# Patient Record
Sex: Female | Born: 1955 | Race: White | Hispanic: No | Marital: Married | Smoking: Never smoker
Health system: Southern US, Community
[De-identification: ages and names within clinical notes are randomized; demographics above are authoritative.]

## PROBLEM LIST (undated history)

## (undated) DIAGNOSIS — C50919 Malignant neoplasm of unspecified site of unspecified female breast: Secondary | ICD-10-CM

## (undated) DIAGNOSIS — Z8041 Family history of malignant neoplasm of ovary: Secondary | ICD-10-CM

## (undated) DIAGNOSIS — E039 Hypothyroidism, unspecified: Secondary | ICD-10-CM

## (undated) DIAGNOSIS — K219 Gastro-esophageal reflux disease without esophagitis: Secondary | ICD-10-CM

## (undated) DIAGNOSIS — Z8052 Family history of malignant neoplasm of bladder: Secondary | ICD-10-CM

## (undated) DIAGNOSIS — Z800 Family history of malignant neoplasm of digestive organs: Secondary | ICD-10-CM

## (undated) HISTORY — DX: Family history of malignant neoplasm of digestive organs: Z80.0

## (undated) HISTORY — PX: BILATERAL TOTAL MASTECTOMY WITH AXILLARY LYMPH NODE DISSECTION: SHX6364

## (undated) HISTORY — DX: Family history of malignant neoplasm of bladder: Z80.52

## (undated) HISTORY — DX: Family history of malignant neoplasm of ovary: Z80.41

## (undated) HISTORY — DX: Malignant neoplasm of unspecified site of unspecified female breast: C50.919

## (undated) HISTORY — PX: BREAST LUMPECTOMY: SHX2

---

## 1985-09-06 HISTORY — PX: DIAGNOSTIC LAPAROSCOPY: SUR761

## 2001-02-22 ENCOUNTER — Ambulatory Visit: Admission: RE | Admit: 2001-02-22 | Discharge: 2001-04-05 | Payer: Self-pay | Admitting: Radiation Oncology

## 2001-04-06 ENCOUNTER — Ambulatory Visit: Admission: RE | Admit: 2001-04-06 | Discharge: 2001-07-05 | Payer: Self-pay | Admitting: Radiation Oncology

## 2006-05-10 ENCOUNTER — Ambulatory Visit: Payer: Self-pay | Admitting: Oncology

## 2006-05-13 ENCOUNTER — Ambulatory Visit: Admission: RE | Admit: 2006-05-13 | Discharge: 2006-05-29 | Payer: Self-pay | Admitting: Radiation Oncology

## 2006-06-09 ENCOUNTER — Ambulatory Visit: Admission: EM | Admit: 2006-06-09 | Discharge: 2006-06-09 | Payer: Self-pay | Admitting: Oncology

## 2006-06-09 ENCOUNTER — Encounter (INDEPENDENT_AMBULATORY_CARE_PROVIDER_SITE_OTHER): Payer: Self-pay | Admitting: Cardiology

## 2006-06-17 ENCOUNTER — Ambulatory Visit (HOSPITAL_COMMUNITY): Admission: RE | Admit: 2006-06-17 | Discharge: 2006-06-17 | Payer: Self-pay | Admitting: Oncology

## 2006-06-28 ENCOUNTER — Ambulatory Visit: Payer: Self-pay | Admitting: Oncology

## 2006-06-30 LAB — CBC WITH DIFFERENTIAL/PLATELET
Basophils Absolute: 0.1 10*3/uL (ref 0.0–0.1)
EOS%: 0.7 % (ref 0.0–7.0)
Eosinophils Absolute: 0 10*3/uL (ref 0.0–0.5)
LYMPH%: 26.6 % (ref 14.0–48.0)
MCH: 30.9 pg (ref 26.0–34.0)
MCV: 90.4 fL (ref 81.0–101.0)
MONO%: 15.9 % — ABNORMAL HIGH (ref 0.0–13.0)
NEUT#: 2.3 10*3/uL (ref 1.5–6.5)
Platelets: 308 10*3/uL (ref 145–400)
RBC: 3.96 10*6/uL (ref 3.70–5.32)
RDW: 13.5 % (ref 11.3–14.5)

## 2006-07-21 LAB — CBC WITH DIFFERENTIAL/PLATELET
BASO%: 1.4 % (ref 0.0–2.0)
EOS%: 0.5 % (ref 0.0–7.0)
Eosinophils Absolute: 0 10*3/uL (ref 0.0–0.5)
LYMPH%: 27.2 % (ref 14.0–48.0)
MCH: 30.7 pg (ref 26.0–34.0)
MCHC: 34.1 g/dL (ref 32.0–36.0)
MCV: 90 fL (ref 81.0–101.0)
MONO%: 16.4 % — ABNORMAL HIGH (ref 0.0–13.0)
NEUT#: 2.3 10*3/uL (ref 1.5–6.5)
Platelets: 334 10*3/uL (ref 145–400)
RBC: 3.6 10*6/uL — ABNORMAL LOW (ref 3.70–5.32)
RDW: 14.3 % (ref 11.3–14.5)

## 2006-07-21 LAB — COMPREHENSIVE METABOLIC PANEL
AST: 16 U/L (ref 0–37)
Albumin: 4.3 g/dL (ref 3.5–5.2)
Alkaline Phosphatase: 79 U/L (ref 39–117)
Glucose, Bld: 98 mg/dL (ref 70–99)
Potassium: 4.1 mEq/L (ref 3.5–5.3)
Sodium: 138 mEq/L (ref 135–145)
Total Bilirubin: 0.2 mg/dL — ABNORMAL LOW (ref 0.3–1.2)
Total Protein: 6.7 g/dL (ref 6.0–8.3)

## 2006-08-11 LAB — CBC WITH DIFFERENTIAL/PLATELET
Basophils Absolute: 0 10*3/uL (ref 0.0–0.1)
EOS%: 1 % (ref 0.0–7.0)
Eosinophils Absolute: 0 10*3/uL (ref 0.0–0.5)
HCT: 31.4 % — ABNORMAL LOW (ref 34.8–46.6)
HGB: 10.7 g/dL — ABNORMAL LOW (ref 11.6–15.9)
MCH: 31.2 pg (ref 26.0–34.0)
MCV: 91.8 fL (ref 81.0–101.0)
MONO%: 21.4 % — ABNORMAL HIGH (ref 0.0–13.0)
NEUT#: 1.9 10*3/uL (ref 1.5–6.5)
NEUT%: 55.1 % (ref 39.6–76.8)
lymph#: 0.7 10*3/uL — ABNORMAL LOW (ref 0.9–3.3)

## 2006-08-22 ENCOUNTER — Ambulatory Visit: Payer: Self-pay | Admitting: Oncology

## 2006-08-25 LAB — CBC WITH DIFFERENTIAL/PLATELET
Basophils Absolute: 0 10*3/uL (ref 0.0–0.1)
EOS%: 3.3 % (ref 0.0–7.0)
Eosinophils Absolute: 0 10*3/uL (ref 0.0–0.5)
HGB: 9.8 g/dL — ABNORMAL LOW (ref 11.6–15.9)
LYMPH%: 45.4 % (ref 14.0–48.0)
MCH: 32.2 pg (ref 26.0–34.0)
MCV: 91.7 fL (ref 81.0–101.0)
MONO%: 24.1 % — ABNORMAL HIGH (ref 0.0–13.0)
Platelets: 156 10*3/uL (ref 145–400)
RDW: 16.1 % — ABNORMAL HIGH (ref 11.3–14.5)

## 2006-09-06 HISTORY — PX: BILATERAL TOTAL MASTECTOMY WITH AXILLARY LYMPH NODE DISSECTION: SHX6364

## 2006-09-08 LAB — CBC WITH DIFFERENTIAL/PLATELET
BASO%: 1.6 % (ref 0.0–2.0)
LYMPH%: 21.3 % (ref 14.0–48.0)
MCHC: 34.4 g/dL (ref 32.0–36.0)
MCV: 91 fL (ref 81.0–101.0)
MONO%: 14.3 % — ABNORMAL HIGH (ref 0.0–13.0)
Platelets: 259 10*3/uL (ref 145–400)
RBC: 3.58 10*6/uL — ABNORMAL LOW (ref 3.70–5.32)
RDW: 13.9 % (ref 11.3–14.5)
WBC: 4.8 10*3/uL (ref 3.9–10.0)

## 2006-09-12 ENCOUNTER — Ambulatory Visit: Admission: RE | Admit: 2006-09-12 | Discharge: 2006-11-28 | Payer: Self-pay | Admitting: Radiation Oncology

## 2006-09-12 ENCOUNTER — Ambulatory Visit (HOSPITAL_COMMUNITY): Admission: RE | Admit: 2006-09-12 | Discharge: 2006-09-12 | Payer: Self-pay | Admitting: Oncology

## 2007-01-13 ENCOUNTER — Ambulatory Visit: Payer: Self-pay | Admitting: Oncology

## 2007-07-21 ENCOUNTER — Ambulatory Visit: Payer: Self-pay | Admitting: Oncology

## 2008-02-22 ENCOUNTER — Ambulatory Visit: Payer: Self-pay | Admitting: Oncology

## 2009-02-21 ENCOUNTER — Ambulatory Visit: Payer: Self-pay | Admitting: Oncology

## 2009-02-25 ENCOUNTER — Ambulatory Visit (HOSPITAL_COMMUNITY): Admission: RE | Admit: 2009-02-25 | Discharge: 2009-02-25 | Payer: Self-pay | Admitting: Oncology

## 2010-02-20 ENCOUNTER — Ambulatory Visit: Payer: Self-pay | Admitting: Oncology

## 2011-03-08 ENCOUNTER — Encounter (HOSPITAL_BASED_OUTPATIENT_CLINIC_OR_DEPARTMENT_OTHER): Payer: Managed Care, Other (non HMO) | Admitting: Oncology

## 2011-03-08 DIAGNOSIS — C50419 Malignant neoplasm of upper-outer quadrant of unspecified female breast: Secondary | ICD-10-CM

## 2012-02-14 ENCOUNTER — Telehealth: Payer: Self-pay | Admitting: Oncology

## 2012-02-14 NOTE — Telephone Encounter (Signed)
S/w pt re appt for 7/9. °

## 2012-03-10 ENCOUNTER — Encounter: Payer: Self-pay | Admitting: *Deleted

## 2012-03-14 ENCOUNTER — Ambulatory Visit (HOSPITAL_BASED_OUTPATIENT_CLINIC_OR_DEPARTMENT_OTHER): Payer: Managed Care, Other (non HMO) | Admitting: Oncology

## 2012-03-14 ENCOUNTER — Telehealth: Payer: Self-pay | Admitting: Oncology

## 2012-03-14 VITALS — BP 105/66 | HR 74 | Temp 97.7°F | Ht 61.3 in | Wt 142.5 lb

## 2012-03-14 DIAGNOSIS — D0590 Unspecified type of carcinoma in situ of unspecified breast: Secondary | ICD-10-CM

## 2012-03-14 DIAGNOSIS — C50419 Malignant neoplasm of upper-outer quadrant of unspecified female breast: Secondary | ICD-10-CM

## 2012-03-14 DIAGNOSIS — C50919 Malignant neoplasm of unspecified site of unspecified female breast: Secondary | ICD-10-CM | POA: Insufficient documentation

## 2012-03-14 DIAGNOSIS — Z9010 Acquired absence of unspecified breast and nipple: Secondary | ICD-10-CM

## 2012-03-14 DIAGNOSIS — R059 Cough, unspecified: Secondary | ICD-10-CM

## 2012-03-14 DIAGNOSIS — R05 Cough: Secondary | ICD-10-CM

## 2012-03-14 NOTE — Progress Notes (Signed)
   [facility]    OFFICE PROGRESS NOTE   INTERVAL HISTORY:   She completed arimidex at the end of January 2013. No new complaint. She has a chronic cough in the mornings. No change at the chest wall, no shortness of breath, good appetite.  Objective:  Vital signs in last 24 hours:  Blood pressure 105/66, pulse 74, temperature 97.7 F (36.5 C), temperature source Oral, height 5' 1.3" (1.557 m), weight 142 lb 8 oz (64.638 kg).    HEENT: Neck without mass Lymphatics: No cervical, supraclavicular, or axillary nodes Resp: Few end inspiratory rhonchi bilaterally that cleared after several respirations, good air movement bilaterally, no respiratory distress Cardio: Regular rate and rhythm, no gallop GI: No hepatomegaly Vascular: No leg edema Breast: Status post bilateral mastectomy with TRAM reconstructions, nodularity at the upper lateral aspect of the left TRAM, no evidence for chest wall tumor recurrence     Medications: I have reviewed the patient's current medications.  Assessment/Plan: 1. Stage I right-sided breast cancer status post 4 cycles of adjuvant AC chemotherapy.  She began Arimidex in February 2008, completed at the end of January 2013. 2. 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 3. Bilateral mastectomies and TRAM reconstruction in April 2008.   Disposition:  She remains in remission from breast cancer. She will return for an office visit in one year.   Thornton Papas, MD  03/14/2012  1:34 PM

## 2012-03-14 NOTE — Telephone Encounter (Signed)
gv pt appt schedule for July 2014.  °

## 2012-05-02 ENCOUNTER — Other Ambulatory Visit: Payer: Self-pay | Admitting: *Deleted

## 2012-05-02 NOTE — Telephone Encounter (Signed)
error 

## 2012-05-15 ENCOUNTER — Ambulatory Visit: Admission: RE | Admit: 2012-05-15 | Payer: Managed Care, Other (non HMO) | Source: Ambulatory Visit

## 2012-05-15 ENCOUNTER — Ambulatory Visit: Payer: Managed Care, Other (non HMO) | Admitting: Radiation Oncology

## 2013-02-20 ENCOUNTER — Telehealth: Payer: Self-pay | Admitting: Oncology

## 2013-02-20 NOTE — Telephone Encounter (Signed)
Pt appt r/s from 7/8 to 8/5 Md only per Dr. Truett Perna

## 2013-03-13 ENCOUNTER — Ambulatory Visit: Payer: Managed Care, Other (non HMO) | Admitting: Oncology

## 2013-04-10 ENCOUNTER — Ambulatory Visit: Payer: Managed Care, Other (non HMO) | Admitting: Oncology

## 2013-04-12 ENCOUNTER — Telehealth: Payer: Self-pay | Admitting: Oncology

## 2013-04-12 ENCOUNTER — Ambulatory Visit (HOSPITAL_BASED_OUTPATIENT_CLINIC_OR_DEPARTMENT_OTHER): Payer: Managed Care, Other (non HMO) | Admitting: Oncology

## 2013-04-12 ENCOUNTER — Ambulatory Visit: Payer: Managed Care, Other (non HMO) | Admitting: Oncology

## 2013-04-12 VITALS — BP 117/64 | HR 104 | Temp 98.2°F | Resp 20 | Ht 61.3 in | Wt 154.2 lb

## 2013-04-12 DIAGNOSIS — Z853 Personal history of malignant neoplasm of breast: Secondary | ICD-10-CM

## 2013-04-12 DIAGNOSIS — C50919 Malignant neoplasm of unspecified site of unspecified female breast: Secondary | ICD-10-CM

## 2013-04-12 NOTE — Telephone Encounter (Signed)
gv and printed appt sched and avs for pt  °

## 2013-04-12 NOTE — Progress Notes (Signed)
   [facility]    OFFICE PROGRESS NOTE   INTERVAL HISTORY:   She returns as scheduled. She feels well. No change in either chest wall. She has a cough in the mornings. No dyspnea. Good appetite.  Objective:  Vital signs in last 24 hours:  Blood pressure 117/64, pulse 104, temperature 98.2 F (36.8 C), temperature source Oral, resp. rate 20, height 5' 1.3" (1.557 m), weight 154 lb 3.2 oz (69.945 kg), SpO2 97.00%.    HEENT: Neck without mass Lymphatics: No cervical, supraclavicular, or axillary nodes Resp: Scattered and inspiratory rhonchi at the posterior chest bilaterally, good air movement bilaterally, no respiratory distress Cardio: Regular rate and rhythm GI: No hepatomegaly Vascular: No leg edema Breasts: Status post bilateral mastectomy with TRAM reconstructions, firm nodularity at the lateral aspect of the left TRAM-? Fat necrosis (she reports this is a chronic finding), no evidence for chest wall tumor recurrence     Medications: I have reviewed the patient's current medications.  Assessment/Plan:  1. Stage I right-sided breast cancer status post 4 cycles of adjuvant AC chemotherapy. She began Arimidex in February 2008, completed at the end of January 2013. 2. 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 3. Bilateral mastectomies and TRAM reconstruction in April 2008.     Disposition:  [facility]oni Schwartz remains in remission from breast cancer. She will return for an office visit in one year.   Thornton Papas, MD  04/12/2013  3:31 PM

## 2014-02-26 ENCOUNTER — Telehealth: Payer: Self-pay | Admitting: Oncology

## 2014-02-26 NOTE — Telephone Encounter (Signed)
Pt called and r/s appt to 8/27

## 2014-04-12 ENCOUNTER — Ambulatory Visit: Payer: Managed Care, Other (non HMO) | Admitting: Oncology

## 2014-05-02 ENCOUNTER — Telehealth: Payer: Self-pay | Admitting: Oncology

## 2014-05-02 ENCOUNTER — Ambulatory Visit (HOSPITAL_BASED_OUTPATIENT_CLINIC_OR_DEPARTMENT_OTHER): Payer: BC Managed Care – PPO | Admitting: Oncology

## 2014-05-02 VITALS — BP 118/70 | HR 87 | Temp 98.5°F | Resp 20 | Ht 61.3 in | Wt 157.1 lb

## 2014-05-02 DIAGNOSIS — Z853 Personal history of malignant neoplasm of breast: Secondary | ICD-10-CM

## 2014-05-02 DIAGNOSIS — C50919 Malignant neoplasm of unspecified site of unspecified female breast: Secondary | ICD-10-CM

## 2014-05-02 NOTE — Telephone Encounter (Signed)
gv and printed appt sched and avs for pt for Aug 2016 °

## 2014-05-02 NOTE — Progress Notes (Signed)
  [place] OFFICE PROGRESS NOTE   Diagnosis: Breast cancer  INTERVAL HISTORY:   She returns as scheduled. No change over either chest wall. No pain. She recently returned from a cruise to [place]. She developed a "cold "while on a cruise and feels there may be fluid on the left ear. She reports a negative evaluation by her primary physician. No dyspnea.  Objective:  Vital signs in last 24 hours:  Blood pressure 118/70, pulse 87, temperature 98.5 F (36.9 C), temperature source Oral, resp. rate 20, height 5' 1.3" (1.557 m), weight 157 lb 1.6 oz (71.26 kg).    HEENT: Neck without mass. Both external canals and tympanic membranes appear clear. Lymphatics: No cervical, supraclavicular, or axillary nodes Resp: Lungs clear bilaterally, no respiratory distress Cardio: Regular rate and rhythm GI: No hepatomegaly Vascular: No leg edema Breasts: Status post bilateral mastectomy with TRAM reconstructions,firm nodularity at the lateral aspect of the left TRAM (she notes this is a chronic finding). No evidence for chest wall tumor recurrence.   Medications: I have reviewed the patient's current medications.  Assessment/Plan: 1. Stage I right-sided breast cancer status post 4 cycles of adjuvant AC chemotherapy. She began Arimidex in February 2008, completed at the end of January 2013. 2. 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 3. Bilateral mastectomies and TRAM reconstruction in April 2008.   Disposition:  Ms. Madding remains in clinical remission from breast cancer. She would like to continue followup at the [facility]. She will return for an office visit in one year. She will see her surgeon in the interim.  Betsy Coder, MD  05/02/2014  9:25 AM

## 2015-04-24 ENCOUNTER — Telehealth: Payer: Self-pay | Admitting: Oncology

## 2015-04-24 ENCOUNTER — Ambulatory Visit (HOSPITAL_BASED_OUTPATIENT_CLINIC_OR_DEPARTMENT_OTHER): Payer: BLUE CROSS/BLUE SHIELD | Admitting: Oncology

## 2015-04-24 VITALS — BP 112/50 | HR 80 | Temp 98.4°F | Resp 18 | Ht 61.3 in | Wt 161.2 lb

## 2015-04-24 DIAGNOSIS — Z853 Personal history of malignant neoplasm of breast: Secondary | ICD-10-CM

## 2015-04-24 DIAGNOSIS — C50919 Malignant neoplasm of unspecified site of unspecified female breast: Secondary | ICD-10-CM

## 2015-04-24 NOTE — Telephone Encounter (Signed)
Gave and printed appt sched and avs for pt for Aug 2017 °

## 2015-04-24 NOTE — Progress Notes (Signed)
  [facility] OFFICE PROGRESS NOTE   Diagnosis: Breast cancer  INTERVAL HISTORY:   [facility] returns as scheduled. She feels well. Good appetite. No complaint. She fractured her right upper arm several months during a fall.  Objective:  Vital signs in last 24 hours:  Blood pressure 112/50, pulse 80, temperature 98.4 F (36.9 C), temperature source Oral, resp. rate 18, height 5' 1.3" (1.557 m), weight 161 lb 3.2 oz (73.12 kg), SpO2 98 %.    HEENT: Neck without mass Lymphatics: No cervical, supraclavicular, or axillary nodes Resp: Coarse rhonchi at the extreme posterior base bilaterally that cleared after several respirations, no respiratory distress Cardio: Regular rate and rhythm GI: No hepatomegaly Vascular: No leg edema Breast: Status post bilateral mastectomy with TRAM reconstructions. Firm nodularity at the superior and lateral aspect of the left TRAM, no evidence for chest wall tumor recurrence.   Lab Results:  Lab Results  Component Value Date   WBC 4.8 09/08/2006   HGB 11.2* 09/08/2006   HCT 32.6* 09/08/2006   MCV 91.0 09/08/2006   PLT 259 09/08/2006   NEUTROABS 3.0 09/08/2006     Medications: I have reviewed the patient's current medications.  Assessment/Plan: 1. Stage I right-sided breast cancer status post 4 cycles of adjuvant AC chemotherapy. She began Arimidex in February 2008, completed at the end of January 2013. 2. 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 3. Bilateral mastectomies and TRAM reconstruction in April 2008.    Disposition:  [facility] Schwartz remains in clinical remission from breast cancer. She would like to continue follow-up at the [facility]. She will be scheduled for an office visit in one year.  Betsy Coder, MD  04/24/2015  9:20 AM

## 2016-04-22 ENCOUNTER — Telehealth: Payer: Self-pay | Admitting: Oncology

## 2016-04-22 ENCOUNTER — Ambulatory Visit (HOSPITAL_BASED_OUTPATIENT_CLINIC_OR_DEPARTMENT_OTHER): Payer: BLUE CROSS/BLUE SHIELD | Admitting: Oncology

## 2016-04-22 VITALS — BP 116/63 | HR 100 | Temp 98.4°F | Resp 18 | Ht 61.3 in | Wt 163.0 lb

## 2016-04-22 DIAGNOSIS — Z853 Personal history of malignant neoplasm of breast: Secondary | ICD-10-CM | POA: Diagnosis not present

## 2016-04-22 DIAGNOSIS — C50919 Malignant neoplasm of unspecified site of unspecified female breast: Secondary | ICD-10-CM

## 2016-04-22 NOTE — Progress Notes (Signed)
  [place] OFFICE PROGRESS NOTE   Diagnosis: Breast cancer  INTERVAL HISTORY:   Ms. Toni Schwartz returns as scheduled. She feels well. She reports a chronic cough in the mornings. She is seeing Dr. Georgina Peer frequently for adjustment of the thyroid hormone dose. No change over the chest wall. No other complaint.  Objective:  Vital signs in last 24 hours:  Blood pressure 116/63, pulse 100, temperature 98.4 F (36.9 C), temperature source Oral, resp. rate 18, height 5' 1.3" (1.557 m), weight 163 lb (73.9 kg), SpO2 99 %.    HEENT: Neck without mass Lymphatics: No cervical, supraclavicular, or axillary nodes Resp: Coarse and inspiratory rhonchi at the posterior bases bilaterally, no respiratory distress Cardio: Regular rate and rhythm with an occasional pause. GI: No hepatosplenomegaly Vascular: No leg edema Breast: Status post bilateral mastectomy with TRAM reconstructions. Firm nodularity at the medial and lateral aspect of the left TRAM. No evidence for chest wall tumor recurrence.     Medications: I have reviewed the patient's current medications.  Assessment/Plan: 1. Stage I right-sided breast cancer status post 4 cycles of adjuvant AC chemotherapy. She began Arimidex in February 2008, completed at the end of January 2013. 2. 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 3. Bilateral mastectomies and TRAM reconstruction in April 2008.   Disposition:  Toni Schwartz remains in clinical remission from breast cancer. She would like to continue follow-up at the [facility]. She will return for an office visit in one year.  Betsy Coder, MD  04/22/2016  10:11 AM

## 2016-04-22 NOTE — Telephone Encounter (Signed)
Gave patient avs report and appointment for August 2018.

## 2017-04-21 ENCOUNTER — Telehealth: Payer: Self-pay | Admitting: Oncology

## 2017-04-21 ENCOUNTER — Ambulatory Visit (HOSPITAL_BASED_OUTPATIENT_CLINIC_OR_DEPARTMENT_OTHER): Payer: BLUE CROSS/BLUE SHIELD | Admitting: Oncology

## 2017-04-21 VITALS — BP 103/65 | HR 88 | Temp 99.5°F | Resp 17 | Wt 163.8 lb

## 2017-04-21 DIAGNOSIS — R05 Cough: Secondary | ICD-10-CM

## 2017-04-21 DIAGNOSIS — Z86000 Personal history of in-situ neoplasm of breast: Secondary | ICD-10-CM

## 2017-04-21 DIAGNOSIS — Z853 Personal history of malignant neoplasm of breast: Secondary | ICD-10-CM

## 2017-04-21 DIAGNOSIS — C50911 Malignant neoplasm of unspecified site of right female breast: Secondary | ICD-10-CM

## 2017-04-21 DIAGNOSIS — C50912 Malignant neoplasm of unspecified site of left female breast: Principal | ICD-10-CM

## 2017-04-21 NOTE — Progress Notes (Signed)
  [place] OFFICE PROGRESS NOTE   Diagnosis: Breast cancer  INTERVAL HISTORY:   Ms. Gammage returns for a scheduled visit. She feels well. She has an intermittent cough. She has chest soreness after coughing. No fever or dyspnea. Good appetite.  Objective:  Vital signs in last 24 hours:  There were no vitals taken for this visit.    HEENT: Neck without mass Lymphatics: No cervical, supraclavicular, or axillary nodes Resp: Scattered coarse rhonchi that cleared after several respirations, good air movement bilaterally, no respiratory distress Cardio: Regular rate and rhythm GI: No hepatomegaly Vascular: No leg edema Breasts: Status post bilateral mastectomy with TRAM reconstructions. Firm nodularity at the lateral aspect of the left TRAM. No evidence for chest wall tumor recurrence.    Medications: I have reviewed the patient's current medications.  Assessment/Plan: 1. Stage I right-sided breast cancer status post 4 cycles of adjuvant AC chemotherapy. She began Arimidex in February 2008, completed at the end of January 2013. 2. 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 3. Bilateral mastectomies and TRAM reconstruction in April 2008.    Disposition:  Ms. Hoch remains in clinical remission from breast cancer. She would like to continue follow-up at the [facility]. She will return for an office visit in one year. 15 minutes were spent with the patient today. The majority of the time was used for counseling and coordination of care.  Donneta Romberg, MD  04/21/2017  12:41 PM

## 2017-04-21 NOTE — Telephone Encounter (Signed)
Gave patient avs and calendar for appts.  °

## 2017-09-12 ENCOUNTER — Telehealth: Payer: Self-pay

## 2017-09-12 NOTE — Telephone Encounter (Signed)
Daughter asked Dr. Benay Spice if patient has had BRCA testing with our office. No results found for BRCA testing. Called patient to inform her and inquire. No answer.

## 2018-04-21 ENCOUNTER — Telehealth: Payer: Self-pay | Admitting: Oncology

## 2018-04-21 ENCOUNTER — Inpatient Hospital Stay: Payer: BLUE CROSS/BLUE SHIELD | Attending: Oncology | Admitting: Oncology

## 2018-04-21 VITALS — BP 118/78 | HR 81 | Temp 98.8°F | Resp 18 | Ht 61.3 in | Wt 165.5 lb

## 2018-04-21 DIAGNOSIS — Z86000 Personal history of in-situ neoplasm of breast: Secondary | ICD-10-CM | POA: Diagnosis not present

## 2018-04-21 DIAGNOSIS — Z9221 Personal history of antineoplastic chemotherapy: Secondary | ICD-10-CM

## 2018-04-21 DIAGNOSIS — Z9013 Acquired absence of bilateral breasts and nipples: Secondary | ICD-10-CM | POA: Diagnosis not present

## 2018-04-21 DIAGNOSIS — Z853 Personal history of malignant neoplasm of breast: Secondary | ICD-10-CM

## 2018-04-21 DIAGNOSIS — C50911 Malignant neoplasm of unspecified site of right female breast: Secondary | ICD-10-CM

## 2018-04-21 DIAGNOSIS — C50912 Malignant neoplasm of unspecified site of left female breast: Secondary | ICD-10-CM

## 2018-04-21 NOTE — Telephone Encounter (Signed)
Gave patient avs and calendar of upcoming appts.  °

## 2018-04-21 NOTE — Progress Notes (Signed)
  [place] OFFICE PROGRESS NOTE   Diagnosis: Breast cancer  INTERVAL HISTORY:   Ms. Raczynski returns for a scheduled visit.  She reports a cough for the past 1 month.  She has been evaluated by her primary physician.  The cough is worse when she is in bed at night.  No dyspnea or fever.  Discomfort at the lateral abdomen/lower chest when lying down.  Objective:  Vital signs in last 24 hours:  Blood pressure 118/78, pulse 81, temperature 98.8 F (37.1 C), temperature source Oral, resp. rate 18, height 5' 1.3" (1.557 m), weight 165 lb 8 oz (75.1 kg), SpO2 98 %.    HEENT: Neck without mass Lymphatics: No cervical, supraclavicular, axillary nodes Resp: Lungs clear bilaterally, no respiratory distress Cardio: Regular rate and rhythm GI: No hepatosplenomegaly, no mass, nontender Vascular: No leg edema Breast: Status post bilateral mastectomy with TRAM reconstructions.  No evidence for chest wall tumor recurrence.  1 cm nodular area at the lateral aspect of the left TRAM scar  Medications: I have reviewed the patient's current medications.   Assessment/Plan: 1. Stage I right-sided breast cancer status post 4 cycles of adjuvant AC chemotherapy. She began Arimidex in February 2008, completed at the end of January 2013. 2. 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 3. Bilateral mastectomies and TRAM reconstruction in April 2008.   Disposition: Ms. Folden is in remission from breast cancer.  She would like to continue follow-up at the Cancer center.  She will return for an office visit in 1 year.  She would like to have testing for a BRCA mutation.  I have a low clinical suspicion for a BRCA mutation in her case.  She does not wish to see the genetics counselor.  I will investigate options for sending a BRCA analysis.    Betsy Coder, MD  04/21/2018  12:31 PM

## 2019-04-27 ENCOUNTER — Telehealth: Payer: Self-pay | Admitting: Oncology

## 2019-04-27 ENCOUNTER — Inpatient Hospital Stay: Payer: Medicare Other | Attending: Oncology | Admitting: Oncology

## 2019-04-27 ENCOUNTER — Other Ambulatory Visit: Payer: Self-pay

## 2019-04-27 VITALS — BP 113/58 | HR 86 | Temp 98.3°F | Resp 18 | Ht 61.3 in | Wt 165.4 lb

## 2019-04-27 DIAGNOSIS — Z9221 Personal history of antineoplastic chemotherapy: Secondary | ICD-10-CM | POA: Diagnosis not present

## 2019-04-27 DIAGNOSIS — C50911 Malignant neoplasm of unspecified site of right female breast: Secondary | ICD-10-CM

## 2019-04-27 DIAGNOSIS — Z9013 Acquired absence of bilateral breasts and nipples: Secondary | ICD-10-CM | POA: Diagnosis not present

## 2019-04-27 DIAGNOSIS — Z86000 Personal history of in-situ neoplasm of breast: Secondary | ICD-10-CM | POA: Diagnosis not present

## 2019-04-27 DIAGNOSIS — Z853 Personal history of malignant neoplasm of breast: Secondary | ICD-10-CM | POA: Diagnosis not present

## 2019-04-27 DIAGNOSIS — C50912 Malignant neoplasm of unspecified site of left female breast: Secondary | ICD-10-CM

## 2019-04-27 NOTE — Progress Notes (Signed)
  [place] OFFICE PROGRESS NOTE   Diagnosis: Breast cancer  INTERVAL HISTORY:   Ms. Vaupel returns as scheduled.  She feels well.  Good appetite.  No change over the chest wall.  Objective:  Vital signs in last 24 hours:  Blood pressure (!) 113/58, pulse 86, temperature 98.3 F (36.8 C), temperature source Oral, resp. rate 18, height 5' 1.3" (1.557 m), weight 165 lb 6.4 oz (75 kg), SpO2 95 %.   Limited physical examination secondary to distancing with the COVID pandemic HEENT: Neck without mass Lymphatics: No cervical, supraclavicular, or axillary nodes GI: No hepatosplenomegaly, no mass, nontender Vascular: No leg edema Breasts: Status post bilateral mastectomy with TRAM reconstructions.  No evidence for chest wall tumor recurrence.  Both axillae appear benign.   Medications: I have reviewed the patient's current medications.   Assessment/Plan: 1. Stage I right-sided breast cancer status post 4 cycles of adjuvant AC chemotherapy. She began Arimidex in February 2008, completed at the end of January 2013. 2. 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 3. Bilateral mastectomies and TRAM reconstruction in April 2008.    Disposition: Ms. Menzie is in remission from breast cancer.  She would like to continue follow-up at the Cancer center.  She will return for office visit in 1 year.  Betsy Coder, MD  04/27/2019  12:34 PM

## 2019-04-27 NOTE — Telephone Encounter (Signed)
Scheduled appointments per 08/21 los, patient received avs and calender.

## 2019-05-02 ENCOUNTER — Telehealth: Payer: Self-pay | Admitting: *Deleted

## 2019-05-02 NOTE — Telephone Encounter (Signed)
Per Dr. Benay Spice: Has patient had BRCA testing? Does she still want to have this? Called patient: she has not had this done and still not sure if she wants to. She will discuss w/daugther. Requests nurse call her in a month.

## 2019-06-27 ENCOUNTER — Telehealth: Payer: Self-pay | Admitting: *Deleted

## 2019-06-27 DIAGNOSIS — C50911 Malignant neoplasm of unspecified site of right female breast: Secondary | ICD-10-CM

## 2019-06-27 NOTE — Telephone Encounter (Signed)
Called patient to follow up on her decision to see genetics for BRCA testing. She reports her daughter is pushing her to have it done, so she agrees. Will place referral and she will be called.

## 2019-06-28 ENCOUNTER — Telehealth: Payer: Self-pay | Admitting: Oncology

## 2019-06-28 NOTE — Telephone Encounter (Signed)
Scheduled appt per 10/21 shc message - unable to reach pt. Left message with apt date and time

## 2019-07-06 ENCOUNTER — Telehealth: Payer: Self-pay | Admitting: Genetic Counselor

## 2019-07-06 NOTE — Telephone Encounter (Signed)
Called patient regarding upcoming [facility] appointment, left a voicemail. This will be considered a walk-in visit. °

## 2019-07-09 ENCOUNTER — Other Ambulatory Visit: Payer: Self-pay | Admitting: Genetic Counselor

## 2019-07-09 ENCOUNTER — Other Ambulatory Visit: Payer: Self-pay

## 2019-07-09 ENCOUNTER — Encounter: Payer: Self-pay | Admitting: Genetic Counselor

## 2019-07-09 ENCOUNTER — Inpatient Hospital Stay: Payer: Medicare Other

## 2019-07-09 ENCOUNTER — Inpatient Hospital Stay: Payer: Medicare Other | Attending: Oncology | Admitting: Genetic Counselor

## 2019-07-09 DIAGNOSIS — C50919 Malignant neoplasm of unspecified site of unspecified female breast: Secondary | ICD-10-CM

## 2019-07-09 DIAGNOSIS — Z8041 Family history of malignant neoplasm of ovary: Secondary | ICD-10-CM

## 2019-07-09 DIAGNOSIS — Z8052 Family history of malignant neoplasm of bladder: Secondary | ICD-10-CM

## 2019-07-09 DIAGNOSIS — Z853 Personal history of malignant neoplasm of breast: Secondary | ICD-10-CM

## 2019-07-09 DIAGNOSIS — Z800 Family history of malignant neoplasm of digestive organs: Secondary | ICD-10-CM

## 2019-07-09 NOTE — Progress Notes (Signed)
REFERRING PROVIDER: Ladell Pier, MD [address] [place],  [place] [address]  PRIMARY PROVIDER:  Alonna Buckler, MD  PRIMARY REASON FOR VISIT:  1. Family history of colon cancer   2. Family history of bladder cancer   3. Family history of ovarian cancer   4. History of breast cancer      HISTORY OF PRESENT ILLNESS:   Toni Schwartz, a 63 y.o. female, was seen for a [facility] cancer genetics consultation at the request of Dr. Benay Spice due to a personal and family history of cancer.  Toni Schwartz presents to clinic today to discuss the possibility of a hereditary predisposition to cancer, genetic testing, and to further clarify her future cancer risks, as well as potential cancer risks for family members.   In 2002, at the age of 37, Toni Schwartz was diagnosed with breast cancer.  This was treated with lumpectomy and radiation.  In 2007, at the age of 1, Toni Schwartz was diagnosed with breast cancer.  The treatment included chemotherapy and bilateral mastectomy.    CANCER HISTORY:  Oncology History   No history exists.     RISK FACTORS:  Menarche was at age 21.  First live birth at age 65.  OCP use for approximately 10+ years.  Ovaries intact: yes.  Hysterectomy: no.  Menopausal status: perimenopausal.  HRT use: 0 years. Colonoscopy: yes; normal. Mammogram within the last year: no. Number of breast biopsies: 2. Up to date with pelvic exams: yes. Any excessive radiation exposure in the past: breast cancer treatment  Past Medical History:  Diagnosis Date  . Breast cancer ([place])    Stage I-right  . Family history of bladder cancer   . Family history of colon cancer   . Family history of ovarian cancer      Social History   Socioeconomic History  . Marital status: Married    Spouse name: Not on file  . Number of children: Not on file  . Years of education: Not on file  . Highest education level: Not on file  Occupational History  . Not on file  Social Needs   . Financial resource strain: Not on file  . Food insecurity    Worry: Not on file    Inability: Not on file  . Transportation needs    Medical: Not on file    Non-medical: Not on file  Tobacco Use  . Smoking status: Not on file  Substance and Sexual Activity  . Alcohol use: Not on file  . Drug use: Not on file  . Sexual activity: Not on file  Lifestyle  . Physical activity    Days per week: Not on file    Minutes per session: Not on file  . Stress: Not on file  Relationships  . Social Herbalist on phone: Not on file    Gets together: Not on file    Attends religious service: Not on file    Active member of club or organization: Not on file    Attends meetings of clubs or organizations: Not on file    Relationship status: Not on file  Other Topics Concern  . Not on file  Social History Narrative  . Not on file     FAMILY HISTORY:  We obtained a detailed, 4-generation family history.  Significant diagnoses are listed below: Family History  Problem Relation Age of Onset  . Bladder Cancer Father 48       had recurrance  in later 61s  . Heart Problems Paternal Uncle   . Heart failure Maternal Grandmother   . COPD Maternal Grandfather   . Heart disease Paternal Grandmother   . Lung disease Paternal Grandfather        cilicosis  . Stomach cancer Other        MGM's mother  . Colon cancer Cousin        dx in her 27s  . Ovarian cancer Cousin        dx in her 53s    The patient has a son and daughter who are cancer free.  She is an only child, and both of her parents are living.  The patient's mother is living at 63.  She has one brother who is 32 and healthy.  The maternal grandparents are deceased.  The grandmother died of heart failure and the grandfather died of COPD.  The grandmother's mother had stomach cancer.  The patient's father was diagnosed with bladder cancer at 69.  He had one brother who died of heart problems.  This brother has a daughter who  had colon cancer and ovarian cancer in her 30's.  The paternal grandparents are deceased from non cancer related issues.  Toni Schwartz is unaware of previous family history of genetic testing for hereditary cancer risks. Patient's maternal ancestors are of [place] descent, and paternal ancestors are of [place] descent. There is no reported Ashkenazi Jewish ancestry. There is no known consanguinity.  GENETIC COUNSELING ASSESSMENT: Toni Schwartz is a 63 y.o. female with a personal and family history of cancer which is somewhat suggestive of a hereditary cancer syndrome and predisposition to cancer given her young age of diagnosis and bilateral cancer, as well as the family history of ovarian cancer. We, therefore, discussed and recommended the following at today's visit.   DISCUSSION: We discussed that 5 - 10% of breast cancer is hereditary, with most cases associated with BRCA mutations.  There are other genes that can be associated with hereditary breast cancer syndromes.  These include ATM, CHEK2 and PALB2.  We discussed that testing is beneficial for several reasons including knowing how to follow individuals after completing their treatment, and understand if other family members could be at risk for cancer and allow them to undergo genetic testing.   We reviewed the characteristics, features and inheritance patterns of hereditary cancer syndromes. We also discussed genetic testing, including the appropriate family members to test, the process of testing, insurance coverage and turn-around-time for results. We discussed the implications of a negative, positive, carrier and/or variant of uncertain significant result. We recommended Ms. Kennerson pursue genetic testing for the common hereditary cancer gene panel. The Common Hereditary Gene Panel offered by [facility] includes sequencing and/or deletion duplication testing of the following 48 genes: APC, ATM, AXIN2, BARD1, BMPR1A, BRCA1, BRCA2, BRIP1, CDH1, CDK4, CDKN2A  (p14ARF), CDKN2A (p16INK4a), CHEK2, CTNNA1, DICER1, EPCAM (Deletion/duplication testing only), GREM1 (promoter region deletion/duplication testing only), KIT, MEN1, MLH1, MSH2, MSH3, MSH6, MUTYH, NBN, NF1, NHTL1, PALB2, PDGFRA, PMS2, POLD1, POLE, PTEN, RAD50, RAD51C, RAD51D, RNF43, SDHB, SDHC, SDHD, SMAD4, SMARCA4. STK11, TP53, TSC1, TSC2, and VHL.  The following genes were evaluated for sequence changes only: SDHA and HOXB13 c.251G>A variant only.   Based on Ms. Pertuit's personal and family history of cancer, she meets medical criteria for genetic testing. Despite that she meets criteria, she may still have an out of pocket cost. We discussed that if her out of pocket cost for testing is over $100, the  laboratory will call and confirm whether she wants to proceed with testing.  If the out of pocket cost of testing is less than $100 she will be billed by the genetic testing laboratory.   PLAN: After considering the risks, benefits, and limitations, Ms. Florene Glen provided informed consent to pursue genetic testing and the blood sample was sent to [facility] for analysis of the common hereditary cancer panel. Results should be available within approximately 2-3 weeks' time, at which point they will be disclosed by telephone to Ms. Kepple, as will any additional recommendations warranted by these results. Ms. Peckenpaugh will receive a summary of her genetic counseling visit and a copy of her results once available. This information will also be available in Epic.   Lastly, we encouraged Ms. Link to remain in contact with cancer genetics annually so that we can continuously update the family history and inform her of any changes in cancer genetics and testing that may be of benefit for this family.   Ms. Speece questions were answered to her satisfaction today. Our contact information was provided should additional questions or concerns arise. Thank you for the referral and allowing [place] to share in the  care of your patient.   Hailey Stormer P. Florene Glen, [place], [facility] Licensed, Insurance risk surveyor [email or web address] phone: [phone]  The patient was seen for a total of 45 minutes in face-to-face genetic counseling.  This patient was discussed with Drs. Magrinat, Lindi Adie and/or Burr Medico who agrees with the above.    _______________________________________________________________________ For Office Staff:  Number of people involved in session: 1 Was an Intern/ student involved with case: no

## 2019-07-23 ENCOUNTER — Encounter: Payer: Self-pay | Admitting: Genetic Counselor

## 2019-07-23 DIAGNOSIS — Z1379 Encounter for other screening for genetic and chromosomal anomalies: Secondary | ICD-10-CM | POA: Insufficient documentation

## 2019-07-24 ENCOUNTER — Telehealth: Payer: Self-pay | Admitting: Genetic Counselor

## 2019-07-24 ENCOUNTER — Ambulatory Visit: Payer: Self-pay | Admitting: Genetic Counselor

## 2019-07-24 DIAGNOSIS — Z1379 Encounter for other screening for genetic and chromosomal anomalies: Secondary | ICD-10-CM

## 2019-07-24 NOTE — Telephone Encounter (Signed)
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  Discussed that it appears that it could be paternally inherited.  Explained that we needed to start performing cascade testing and offer genetic testing to her children, parents and extended family.  Explained the complementary testing policy of [facility] and that there is an appointment cost despite the free genetic testing.  Will refer to high risk clinic to discuss additional screening.  She is resistant to a BSO and relayed her mothers complications from this.

## 2019-07-24 NOTE — Telephone Encounter (Signed)
LM on VM that results are back and to please CB.  Left CB instructions. 

## 2019-07-24 NOTE — Progress Notes (Signed)
GENETIC TEST RESULTS   Patient Name: Toni Schwartz Patient Age: 63 y.o. Encounter Date: 07/24/2019  Referring Provider: Betsy Coder, MD    Ms. Barbuto was seen in the [place] clinic due to a personal and family history of cancer and concern regarding a hereditary predisposition to cancer in the family. Please refer to the prior Genetics clinic note for more information regarding Toni Schwartz's medical and family histories and our assessment at the time.   FAMILY HISTORY:  We obtained a detailed, 4-generation family history.  Significant diagnoses are listed below: Family History  Problem Relation Age of Onset   Bladder Cancer Father 18       had recurrance in later 35s   Heart Problems Paternal Uncle    Heart failure Maternal Grandmother    COPD Maternal Grandfather    Heart disease Paternal Grandmother    Lung disease Paternal Grandfather        cilicosis   Stomach cancer Other        MGM's mother   Colon cancer Cousin        dx in her 78s   Ovarian cancer Cousin        dx in her 71s    The patient has a son and daughter who are cancer free.  She is an only child, and both of her parents are living.  The patient's mother is living at 57.  She has one brother who is 9 and healthy.  The maternal grandparents are deceased.  The grandmother died of heart failure and the grandfather died of COPD.  The grandmother's mother had stomach cancer.  The patient's father was diagnosed with bladder cancer at 99.  He had one brother who died of heart problems.  This brother has a daughter who had colon cancer and ovarian cancer in her 90's.  The paternal grandparents are deceased from non cancer related issues.  Ms. Loscalzo is unaware of previous family history of genetic testing for hereditary cancer risks. Patient's maternal ancestors are of [place] descent, and paternal ancestors are of [place] descent. There is no reported Ashkenazi Jewish ancestry. There is no known  consanguinity.    GENETIC TESTING:  At the time of Toni Schwartz's visit, we recommended she pursue genetic testing of the common hereditary cancer test. The genetic testing reported on July 23, 2019 through the common hereditary Cancer Panel offered by [facility] identified a single, heterozygous pathogenic gene mutation called BRCA2, [phone]. There were no deleterious mutations in APC, ATM, AXIN2, BARD1, BMPR1A, BRCA1, BRIP1, CDH1, CDK4, CDKN2A (p14ARF), CDKN2A (p16INK4a), CHEK2, CTNNA1, DICER1, EPCAM (Deletion/duplication testing only), GREM1 (promoter region deletion/duplication testing only), KIT, MEN1, MLH1, MSH2, MSH3, MSH6, MUTYH, NBN, NF1, NHTL1, PALB2, PDGFRA, PMS2, POLD1, POLE, PTEN, RAD50, RAD51C, RAD51D, RNF43, SDHB, SDHC, SDHD, SMAD4, SMARCA4. STK11, TP53, TSC1, TSC2, and VHL.  The following genes were evaluated for sequence changes only: SDHA and HOXB13 c.251G>A variant only. Marland Kitchen   MEDICAL MANAGEMENT: Women who have a BRCA mutation have an increased risk for both breast and ovarian cancer.   Since Toni Schwartz has already had bilateral mastectomies, she has taken the most effective option available to reduce breast cancer risk. At this time, we recommend she continue to follow healthcare management guidelines that have been provided to her by her overseeing providers.   To reduce the risk for ovarian cancer, we recommend Toni Schwartz have a prophylactic bilateral salpingo-oophorectomy when childbearing is completed, if planned. We discussed that screening with CA-125 blood tests and  transvaginal ultrasounds can be done twice per year. However, these tests have not been shown to detect ovarian cancer at an early stage.  Toni Schwartz is resistant to have a BSO.  She reports that her mother had a hysterectomy when she was younger and afterward had many issues.  She did no elaborate what those issues were but indicated that she did not want to go through what her mother went through.  I let her  know that I would refer her to the high risk clinic to discuss all management issues, including screening vs preventive surgery for ovarian cancer.  The goal being that she be well informed of the pros and cons of her choices.  RISK REDUCTION: There are several things that can be offered to individuals who are carriers for BRCA mutations that will reduce the risk for getting cancer.    The use of oral contraceptives can lower the risk for ovarian cancer, and, per case control studies, does not significantly increase the risk for breast cancer in BRCA patients.  Case control studies have shown that oral contraceptives can lower the risk for ovarian cancer in women with BRCA mutations. Additionally, a more recent meta-analysis, including one cohort (n=3,181) and one case control study (1,096 cases and 2,878 controls) also showed an inverse correlation between ovarian cancer and ever having used oral contraceptives (OR, 0.58; 95% CI = 0.46-0.73).  Studies on oral contraceptives and breast cancer have been conflicting, with some studies suggesting that there is not an increased risk for breast cancer in BRCA mutation carriers, while others suggest that there could be a risk.  That said, two meta-analysis studies have shown that there is not an increased risk for breast cancer with oral contraceptive use in BRCA1 and BRCA2 carriers.    In individuals who have a prophylactic bilateral salpingo-oophorectomy (BSO), the risk for breast cancer is reduced by up to 50%.  It has been reported that short term hormone replacement therapy in women undergoing prophylactic BSO does not negate the reduction of breast cancer risk associated with surgery (1.2018 NCCN guidelines).  FAMILY MEMBERS: It is important that all of Ms. Neighbors's relatives (both men and women) know of the presence of this gene mutation. Site-specific genetic testing can sort out who in the family is at risk and who is not.   [name] children have a  50% chance to have inherited this mutation. We recommend they have genetic testing for this same mutation, as identifying the presence of this mutation would allow them to also take advantage of risk-reducing measures.   SUPPORT AND RESOURCES: If Ms. Verma is interested in BRCA-specific information and support, there are two groups, Facing Our Risk (www.facingourrisk.com) and Bright Pink (www.brightpink.org) which some people have found useful. They provide opportunities to speak with other individuals from high-risk families. To locate genetic counselors in other cities, visit the website of the [employer] (ArtistMovie.se) and Secretary/administrator for a Social worker by zip code.  We encouraged Ms. Egley to remain in contact with [place] on an annual basis so we can update her personal and family histories, and let her know of advances in cancer genetics that may benefit the family. Our contact number was provided. Ms. Clift questions were answered to her satisfaction today, and she knows she is welcome to call anytime with additional questions.   Anika Shore P. Florene Glen, [place], [facility] Licensed, Insurance risk surveyor [email or web address] phone: [phone]

## 2019-07-26 ENCOUNTER — Other Ambulatory Visit: Payer: Self-pay | Admitting: Oncology

## 2019-07-26 ENCOUNTER — Encounter: Payer: Self-pay | Admitting: *Deleted

## 2019-07-26 ENCOUNTER — Telehealth: Payer: Self-pay | Admitting: Oncology

## 2019-07-26 NOTE — Telephone Encounter (Signed)
Scheduled appt per 11/19 sch message - pt aware of appt date and time

## 2019-07-26 NOTE — Progress Notes (Signed)
After discussion with genetics counselor, Dr. Benay Spice has requested to see patient in the next month. Scheduling message sent.

## 2019-08-07 ENCOUNTER — Other Ambulatory Visit: Payer: Self-pay | Admitting: Oncology

## 2019-08-20 ENCOUNTER — Inpatient Hospital Stay: Payer: Medicare Other | Attending: Oncology | Admitting: Oncology

## 2019-08-20 ENCOUNTER — Other Ambulatory Visit: Payer: Self-pay

## 2019-08-20 VITALS — BP 120/75 | HR 102 | Temp 98.5°F | Resp 18 | Ht 61.3 in | Wt 165.5 lb

## 2019-08-20 DIAGNOSIS — Z9013 Acquired absence of bilateral breasts and nipples: Secondary | ICD-10-CM | POA: Insufficient documentation

## 2019-08-20 DIAGNOSIS — C50919 Malignant neoplasm of unspecified site of unspecified female breast: Secondary | ICD-10-CM

## 2019-08-20 DIAGNOSIS — Z853 Personal history of malignant neoplasm of breast: Secondary | ICD-10-CM | POA: Insufficient documentation

## 2019-08-20 NOTE — Progress Notes (Signed)
  [place] OFFICE PROGRESS NOTE   Diagnosis: Breast cancer, BRCA2 positive  INTERVAL HISTORY:   Toni Schwartz returns prior to scheduled visit.  She was evaluated by the genetics counselor and found to have a pathogenic mutation for BRCA2.  She feels well.  No complaint.  Objective:  Vital signs in last 24 hours:  Blood pressure 120/75, pulse (!) 102, temperature 98.5 F (36.9 C), temperature source Temporal, resp. rate 18, height 5' 1.3" (1.557 m), weight 165 lb 8 oz (75.1 kg), SpO2 97 %.     Lymphatics: No cervical, supraclavicular, axillary, or inguinal nodes  GI: No hepatosplenomegaly, no mass, no apparent ascites, nontender Vascular: No leg edema Breast: Status post bilateral mastectomy with TRAM reconstruction.  No evidence for chest wall tumor recurrence.    Medications: I have reviewed the patient's current medications.   Assessment/Plan: 1. Stage I right-sided breast cancer status post 4 cycles of adjuvant AC chemotherapy. She began Arimidex in February 2008, completed at the end of January 2013. 2. 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 3. Bilateral mastectomies and TRAM reconstruction in April 2008. 4. BRCA2 heterozygous    Disposition: Toni Schwartz has a history of breast cancer.  She is been found to be a heterozygote for a pathogenic BRCA2 mutation.  We discussed the increased risk of breast, ovarian, and other cancers associated with this diagnosis.  We discussed the indication for a BSO.  She would like to consider screening for ovarian cancer as opposed to surgery.  She agrees to a consultation with Dr. Denman George to discuss the BSO.  I recommended she have a skin exam with her primary physician or dermatologist.  We discussed the increased risk of other cancers including pancreas and prostate cancer.  She reports her daughter tested negative for  BRCA2.  Her son will be tested.  Toni Schwartz will return for an office visit as scheduled in August of 2021.  Betsy Coder, MD  08/20/2019  9:28 AM

## 2020-04-28 ENCOUNTER — Inpatient Hospital Stay: Payer: Medicare PPO | Attending: Oncology | Admitting: Oncology

## 2020-04-28 ENCOUNTER — Other Ambulatory Visit: Payer: Self-pay

## 2020-04-28 ENCOUNTER — Telehealth: Payer: Self-pay | Admitting: Oncology

## 2020-04-28 VITALS — BP 113/86 | HR 76 | Temp 99.4°F | Resp 16 | Ht 61.3 in | Wt 160.4 lb

## 2020-04-28 DIAGNOSIS — Z1502 Genetic susceptibility to malignant neoplasm of ovary: Secondary | ICD-10-CM | POA: Diagnosis present

## 2020-04-28 DIAGNOSIS — Z1273 Encounter for screening for malignant neoplasm of ovary: Secondary | ICD-10-CM | POA: Insufficient documentation

## 2020-04-28 DIAGNOSIS — C50919 Malignant neoplasm of unspecified site of unspecified female breast: Secondary | ICD-10-CM

## 2020-04-28 DIAGNOSIS — Z148 Genetic carrier of other disease: Secondary | ICD-10-CM | POA: Insufficient documentation

## 2020-04-28 NOTE — Progress Notes (Signed)
  Toni Schwartz OFFICE PROGRESS NOTE   Diagnosis: Breast cancer, BRCA2 mutation  INTERVAL HISTORY:   Toni Schwartz returns for a scheduled visit.  She feels well.  No change in either chest wall.  No new complaint.  She reports her daughter tested negative for the BRCA2 mutation.  Her son declined testing.  She has received the COVID-19 vaccine.  Objective:  Vital signs in last 24 hours:  Blood pressure 113/86, pulse 76, temperature 99.4 F (37.4 C), temperature source Tympanic, resp. rate 16, height 5' 1.3" (1.557 m), weight 160 lb 6.4 oz (72.8 kg), SpO2 100 %.   Limited physical examination secondary to distancing with the Covid pandemic Lymphatics: No cervical, supraclavicular, axillary, or inguinal nodes Breast: Status post bilateral mastectomy with TRAM reconstructions.  Firm nodularity at the upper medial aspect of the left TRAM inferior to the Port-A-Cath scar (chronic per patient), no evidence for chest wall tumor recurrence GI: No hepatosplenomegaly, no mass, no apparent ascites Vascular: No leg edema Skin: Multiple cherry moles and benign appearing moles over the trunk and extremities   Lab Results:  Lab Results  Component Value Date   WBC 4.8 09/08/2006   HGB 11.2 (L) 09/08/2006   HCT 32.6 (L) 09/08/2006   MCV 91.0 09/08/2006   PLT 259 09/08/2006   NEUTROABS 3.0 09/08/2006    CMP  Lab Results  Component Value Date   NA 138 07/21/2006   K 4.1 07/21/2006   CL 104 07/21/2006   CO2 25 07/21/2006   GLUCOSE 98 07/21/2006   BUN 16 07/21/2006   CREATININE 0.83 07/21/2006   CALCIUM 9.5 07/21/2006   PROT 6.7 07/21/2006   ALBUMIN 4.3 07/21/2006   AST 16 07/21/2006   ALT 25 07/21/2006   ALKPHOS 79 07/21/2006   BILITOT 0.2 (L) 07/21/2006    Medications: I have reviewed the patient's current medications.   Assessment/Plan:  1. Stage I right-sided breast cancer status post 4 cycles of adjuvant AC chemotherapy. She began Arimidex in February 2008,  completed at the end of January 2013. 2. 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 3. Bilateral mastectomies and TRAM reconstruction in April 2008. 4. BRCA2 heterozygous   Disposition: Toni Schwartz remains in clinical remission from breast cancer.  She is a BRCA2 carrier.  I recommended she see GYN oncology to discuss oophorectomy versus screening for ovarian cancer.  She says that she has decided against oophorectomy for now.  She agrees to consultation with GYN oncology.  I recommended her son be screened for the BRCA2 mutation.  I recommended she see a dermatologist for a skin evaluation.  Toni Schwartz will return for an office visit in 1 year.    Betsy Coder, MD  04/28/2020  12:13 PM

## 2020-04-28 NOTE — Telephone Encounter (Signed)
Scheduled per los. Gave avs and calendar  

## 2020-04-30 ENCOUNTER — Telehealth: Payer: Self-pay | Admitting: *Deleted

## 2020-04-30 NOTE — Telephone Encounter (Signed)
Called patient and scheduled an appt for 8/30 at 9:45 am with Dr Denman George.

## 2020-05-05 ENCOUNTER — Encounter: Payer: Self-pay | Admitting: Gynecologic Oncology

## 2020-05-05 ENCOUNTER — Inpatient Hospital Stay (HOSPITAL_BASED_OUTPATIENT_CLINIC_OR_DEPARTMENT_OTHER): Payer: Medicare PPO | Admitting: Gynecologic Oncology

## 2020-05-05 ENCOUNTER — Inpatient Hospital Stay: Payer: Medicare PPO

## 2020-05-05 ENCOUNTER — Other Ambulatory Visit: Payer: Self-pay

## 2020-05-05 VITALS — BP 97/67 | HR 79 | Temp 98.3°F | Resp 17 | Ht 61.3 in | Wt 161.2 lb

## 2020-05-05 DIAGNOSIS — C50919 Malignant neoplasm of unspecified site of unspecified female breast: Secondary | ICD-10-CM | POA: Diagnosis not present

## 2020-05-05 DIAGNOSIS — Z853 Personal history of malignant neoplasm of breast: Secondary | ICD-10-CM

## 2020-05-05 DIAGNOSIS — Z9221 Personal history of antineoplastic chemotherapy: Secondary | ICD-10-CM

## 2020-05-05 DIAGNOSIS — Z923 Personal history of irradiation: Secondary | ICD-10-CM

## 2020-05-05 DIAGNOSIS — Z1502 Genetic susceptibility to malignant neoplasm of ovary: Secondary | ICD-10-CM | POA: Diagnosis not present

## 2020-05-05 DIAGNOSIS — Z1509 Genetic susceptibility to other malignant neoplasm: Secondary | ICD-10-CM

## 2020-05-05 DIAGNOSIS — Z1501 Genetic susceptibility to malignant neoplasm of breast: Secondary | ICD-10-CM

## 2020-05-05 DIAGNOSIS — Z9013 Acquired absence of bilateral breasts and nipples: Secondary | ICD-10-CM

## 2020-05-05 NOTE — Progress Notes (Signed)
Consult Note: Gyn-Onc  Consult was requested by Dr. Benay Spice for the evaluation of Toni Schwartz 64 y.o. female  CC:  Chief Complaint  Patient presents with  . BRCA2 gene mutation    New Patient  . personal history of bilateral breast cancer    Assessment/Plan:  Toni Schwartz  is a 64 y.o.  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  I informed the patient offer 20 to 25% lifetime risk for ovarian cancer that is conferred with BRCA2 germline mutations.  I discussed that this risk can be mitigated substantially with surgical removal of the tubes and ovaries.  I discussed that this is recommended in a patient of her age she no longer has ovarian function, a desire or ability for future fertility, and in the absence of high quality screening test to detect ovarian cancer at an early curable stage.  The patient expressed some reluctance with proceeding immediately with surgery due to her commitments to her daughter in childcare.  She was thinking she might consider surgery in the beginning of 2022.  I explained that in the interim we will perform 6 monthly Ca1 25 and pelvic ultrasound evaluations.  X explained the limitations of the studies and being able to find ovarian cancer at an earlier stage.  The patient will contact us when she is ready for surgery and schedule a preoperative visit with Joylene John NP prior to that date.  HPI: Toni Schwartz is a 64 year old P2 who was seen in consultation at the request of Dr Benay Spice for evaluation of a deleterious mutation in [place].  The patient reported having a history of bilateral breast cancer in years 2002 in 2007.  This was woman receptor positive.  It was treated with surgery, radiation, and chemotherapy.  She has been NED since her last diagnosis in 2007.  Ultimately she underwent bilateral mastectomy with TRAM flap reconstruction for her breast cancer.  Due to her  strong history of breast cancer, the patient's daughter recommended she be tested for deleterious mutations in BRCA.  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  The patient daughter herself was tested and found to be negative.  The patient also has a son who is presently declining testing.  Her medical history is most significant for breast cancer as stated above, her hypothyroidism.  Her surgical history is most significant for 2 cesarean sections, a diagnostic laparoscopy prior to this for infertility, and a TRAM flap reconstruction of her bilateral mastectomies.  She reported being known to have scar tissue.  Her gynecologic history is remarkable for no history of abnormal Paps.  Up until 2020 when she was age 64 she had regular annual Pap testing but this had been discontinued in accordance with ASC CP guidelines when she reached age 55.  She has a remote history of infertility.  She denied using fertility drugs to conceive.  She has a history of 2 prior cesarean sections.  She reported oral contraceptive use for approximately 10 years in her 51s.  Her family cancer history is unremarkable with the exception of a father who had bladder cancer who was a smoker there is no history of ovarian breast, melanoma, pancreatic cancer.  She is retired, and was a former Web designer. She lives with her husband, and temporarily with her daughter, son-in-law and grandchildren for whom she provides  childcare while they build a new.   Current Meds:  Outpatient Encounter Medications as of 05/05/2020  Medication Sig  . aspirin-acetaminophen-caffeine (EXCEDRIN MIGRAINE) 250-250-65 MG tablet Take by mouth.  . Biotin (BIOTIN 5000) 5 MG CAPS Take 20 mg by mouth daily.  . Calcium Citrate-Vitamin D (CITRACAL + D PO) Take 2 tablets by mouth daily. 400/500  . Cholecalciferol (VITAMIN D3) 1000 UNITS CAPS Take 1,000 Units by mouth  daily.  . Glucosamine-Chondroitin (GLUCOSAMINE CHONDR COMPLEX PO) Take 2 tablets by mouth daily.  Marland Kitchen levothyroxine (SYNTHROID, LEVOTHROID) 88 MCG tablet Take 88 mcg by mouth daily before breakfast.  . Multiple Vitamins-Minerals (CENTRUM SILVER PO) Take 1 tablet by mouth daily.  . pantoprazole (PROTONIX) 40 MG tablet Take 40 mg by mouth.   No facility-administered encounter medications on file as of 05/05/2020.    Allergy:  Allergies  Allergen Reactions  . Sulfa Antibiotics Hives    Social Hx:   Social History   Socioeconomic History  . Marital status: Married    Spouse name: Not on file  . Number of children: Not on file  . Years of education: Not on file  . Highest education level: Not on file  Occupational History  . Not on file  Tobacco Use  . Smoking status: Never Smoker  . Smokeless tobacco: Never Used  Vaping Use  . Vaping Use: Never used  Substance and Sexual Activity  . Alcohol use: Never  . Drug use: Never  . Sexual activity: Not on file  Other Topics Concern  . Not on file  Social History Narrative  . Not on file   Social Determinants of Health   Financial Resource Strain:   . Difficulty of Paying Living Expenses: Not on file  Food Insecurity:   . Worried About Charity fundraiser in the Last Year: Not on file  . Ran Out of Food in the Last Year: Not on file  Transportation Needs:   . Lack of Transportation (Medical): Not on file  . Lack of Transportation (Non-Medical): Not on file  Physical Activity:   . Days of Exercise per Week: Not on file  . Minutes of Exercise per Session: Not on file  Stress:   . Feeling of Stress : Not on file  Social Connections:   . Frequency of Communication with Friends and Family: Not on file  . Frequency of Social Gatherings with Friends and Family: Not on file  . Attends Religious Services: Not on file  . Active Member of Clubs or Organizations: Not on file  . Attends Archivist Meetings: Not on file  .  Marital Status: Not on file  Intimate Partner Violence:   . Fear of Current or Ex-Partner: Not on file  . Emotionally Abused: Not on file  . Physically Abused: Not on file  . Sexually Abused: Not on file    Past Surgical Hx:  Past Surgical History:  Procedure Laterality Date  . BILATERAL TOTAL MASTECTOMY WITH AXILLARY LYMPH NODE DISSECTION    . BREAST LUMPECTOMY     times 2  . CESAREAN SECTION      Past Medical Hx:  Past Medical History:  Diagnosis Date  . Breast cancer ([place])    Stage I-right  . Family history of bladder cancer   . Family history of colon cancer   . Family history of ovarian cancer     Past Gynecological History:  See HPI No LMP recorded.  Family Hx:  Family History  Problem  Relation Age of Onset  . Bladder Cancer Father 67       had recurrance in later 46s  . Heart Problems Paternal Uncle   . Heart failure Maternal Grandmother   . COPD Maternal Grandfather   . Heart disease Paternal Grandmother   . Lung disease Paternal Grandfather        cilicosis  . Stomach cancer Other        MGM's mother  . Colon cancer Cousin        dx in her 21s  . Ovarian cancer Cousin        dx in her 91s    Review of Systems:  Constitutional  Feels well,    ENT Normal appearing ears and nares bilaterally Skin/Breast  No rash, sores, jaundice, itching, dryness Cardiovascular  No chest pain, shortness of breath, or edema  Pulmonary  No cough or wheeze.  Gastro Intestinal  No nausea, vomitting, or diarrhoea. No bright red blood per rectum, no abdominal pain, change in bowel movement, or constipation.  Genito Urinary  No frequency, urgency, dysuria, no postmenopausal bleeding Musculo Skeletal  No myalgia, arthralgia, joint swelling or pain  Neurologic  No weakness, numbness, change in gait,  Psychology  No depression, anxiety, insomnia.   Vitals:  Blood pressure 97/67, pulse 79, temperature 98.3 F (36.8 C), temperature source Tympanic, resp. rate 17,  height 5' 1.3" (1.557 m), weight 161 lb 3.2 oz (73.1 kg), SpO2 97 %.  Physical Exam: WD in NAD Neck  Supple NROM, without any enlargements.  Lymph Node Survey No cervical supraclavicular or inguinal adenopathy Cardiovascular  Pulse normal rate, regularity and rhythm. S1 and S2 normal.  Lungs  Clear to auscultation bilateraly, without wheezes/crackles/rhonchi. Good air movement.  Skin  No rash/lesions/breakdown  Psychiatry  Alert and oriented to person, place, and time  Abdomen  Normoactive bowel sounds, abdomen soft, non-tender and mildly obese with an umbilical hernia. Back No CVA tenderness Genito Urinary  Vulva/vagina: Normal external female genitalia.  No lesions. No discharge or bleeding.  Bladder/urethra:  No lesions or masses, well supported bladder  Vagina: narrow  Cervix: Normal appearing, no lesions.  Uterus:  Small, mobile, no parametrial involvement or nodularity.  Adnexa: no palpable masses. Rectal  Good tone, no masses no cul de sac nodularity.  Extremities  No bilateral cyanosis, clubbing or edema.  60 minutes of total time was spent for this patient encounter, including preparation, face-to-face counseling with the patient and coordination of care, review of imaging (results and images), communication with the referring provider and documentation of the encounter.   Thereasa Solo, MD  05/05/2020, 11:10 AM

## 2020-05-05 NOTE — Patient Instructions (Signed)
Dr Denman George is recommending a risk reducing robotic assisted laparoscopic removal of both tubes and ovaries to reduce/eliminate your risk for ovarian and fallopian tube cancer. Having a BRCA2 gene mutation places you at a 20-25% lifetime risk for ovarian cancer.  Ovarian cancer is usually diagnosed at an advanced stage and not curable, therefore the medical recommendation is for risk reducing surgery rather than screening procedures.  For patients who are unable to have surgery, we recommend monitoring the ovaries at 6 monthly intervals with CA-125 blood test and pelvic ultrasounds.  Is important to note that this testing does not guarantee diagnosis at an early cyst and curable stage should a cancer develop, and does not replace the recommendation for surgery.  We have scheduled you for a blood test and ultrasound to take place now, additionally we will schedule you for 6 monthly repeat testing if you have not had your surgery by February 2022.  When you have determined the timing that you desire surgery, please call Dr. Serita Grit office at [phone].  At that time you will schedule an appointment with our nurse practitioner, Joylene John, who will discuss more details regarding the surgery and recovery.  You should anticipate an outpatient surgery with same-day discharge, 1 month of lifting restrictions, 1 week of driving restrictions, but otherwise minimal convalescence.

## 2020-05-06 ENCOUNTER — Telehealth: Payer: Self-pay

## 2020-05-06 LAB — CA 125: Cancer Antigen (CA) 125: 4.7 U/mL (ref 0.0–38.1)

## 2020-05-06 NOTE — Telephone Encounter (Signed)
Spoke with patient to inform of CA 125 results that are within normal limits.  Patient voiced understanding of above.

## 2020-05-09 ENCOUNTER — Ambulatory Visit (HOSPITAL_COMMUNITY): Payer: Medicare PPO

## 2020-05-16 ENCOUNTER — Ambulatory Visit (HOSPITAL_COMMUNITY)
Admission: RE | Admit: 2020-05-16 | Discharge: 2020-05-16 | Disposition: A | Discharge status: Home / Self Care | Payer: Medicare PPO | Source: Ambulatory Visit | Attending: Gynecologic Oncology | Admitting: Gynecologic Oncology

## 2020-05-16 ENCOUNTER — Telehealth: Payer: Self-pay

## 2020-05-16 ENCOUNTER — Other Ambulatory Visit: Payer: Self-pay

## 2020-05-16 DIAGNOSIS — Z1509 Genetic susceptibility to other malignant neoplasm: Secondary | ICD-10-CM | POA: Insufficient documentation

## 2020-05-16 DIAGNOSIS — Z1501 Genetic susceptibility to malignant neoplasm of breast: Secondary | ICD-10-CM | POA: Insufficient documentation

## 2020-05-16 DIAGNOSIS — Z1502 Genetic susceptibility to malignant neoplasm of ovary: Secondary | ICD-10-CM | POA: Insufficient documentation

## 2020-05-16 NOTE — Telephone Encounter (Signed)
TC from [place] @ [facility] regarding US done this morning.Confirmation of results in chart.Report given to Joylene John, NP.

## 2020-05-20 ENCOUNTER — Telehealth: Payer: Self-pay

## 2020-05-20 ENCOUNTER — Other Ambulatory Visit: Payer: Self-pay | Admitting: Gynecologic Oncology

## 2020-05-20 DIAGNOSIS — Z1509 Genetic susceptibility to other malignant neoplasm: Secondary | ICD-10-CM

## 2020-05-20 DIAGNOSIS — Z1501 Genetic susceptibility to malignant neoplasm of breast: Secondary | ICD-10-CM

## 2020-05-20 DIAGNOSIS — N9489 Other specified conditions associated with female genital organs and menstrual cycle: Secondary | ICD-10-CM

## 2020-05-20 NOTE — Progress Notes (Signed)
[place] reviewed with Dr. Denman George.  Recommendation for MRI to further evaluate the left ovary vs surgery in the next 6 weeks. See RN note.

## 2020-05-20 NOTE — Telephone Encounter (Signed)
Told Ms Linehan that the Bettey Mare a nodule on the left ovary. It could be a cyst.  Dr. Denman George recommends a MRI of the Pelvis to further evaluate vs Surgery with in the next 6 weeks.  Ms Delconte elected to have MRI of the Pelvis as she cannot have any surgery until the new year. Pt scheduled for MRI on Wednesday 05-28-20 at 0900 at [facility].  She needs to arrive at 0830 at [facility] main entrance to be directed where to go for MRI. Pt verbalized understanding.

## 2020-05-28 ENCOUNTER — Other Ambulatory Visit: Payer: Self-pay

## 2020-05-28 ENCOUNTER — Telehealth: Payer: Self-pay

## 2020-05-28 ENCOUNTER — Ambulatory Visit (HOSPITAL_COMMUNITY)
Admission: RE | Admit: 2020-05-28 | Discharge: 2020-05-28 | Disposition: A | Discharge status: Home / Self Care | Payer: Medicare PPO | Source: Ambulatory Visit | Attending: Gynecologic Oncology | Admitting: Gynecologic Oncology

## 2020-05-28 DIAGNOSIS — Z1502 Genetic susceptibility to malignant neoplasm of ovary: Secondary | ICD-10-CM | POA: Diagnosis present

## 2020-05-28 DIAGNOSIS — N9489 Other specified conditions associated with female genital organs and menstrual cycle: Secondary | ICD-10-CM | POA: Insufficient documentation

## 2020-05-28 DIAGNOSIS — Z1501 Genetic susceptibility to malignant neoplasm of breast: Secondary | ICD-10-CM

## 2020-05-28 DIAGNOSIS — Z1509 Genetic susceptibility to other malignant neoplasm: Secondary | ICD-10-CM | POA: Insufficient documentation

## 2020-05-28 MED ORDER — GADOBUTROL 1 MMOL/ML IV SOLN
6.0000 mL | Freq: Once | INTRAVENOUS | Status: AC | PRN
  Administered 2020-05-28: 7 mL via INTRAVENOUS

## 2020-05-28 NOTE — Telephone Encounter (Signed)
TC to patient regarding MRI results.  Per Dr. Denman George, MRI shows cysts that appear benign.  Patient is to follow up as planned with 6 month [place] and Ca 125.  Patient pleased with results and will call in January for March MD appointment, lab and [place].

## 2020-09-25 ENCOUNTER — Telehealth: Payer: Self-pay | Admitting: *Deleted

## 2020-09-25 NOTE — Telephone Encounter (Signed)
Called and left the patient a message to call the office back. Patient needs to be scheduled for an [place] scan.

## 2020-10-09 ENCOUNTER — Inpatient Hospital Stay: Payer: Medicare PPO | Attending: Oncology

## 2020-10-09 ENCOUNTER — Ambulatory Visit (HOSPITAL_COMMUNITY)
Admission: RE | Admit: 2020-10-09 | Discharge: 2020-10-09 | Disposition: A | Discharge status: Home / Self Care | Payer: Medicare PPO | Source: Ambulatory Visit | Attending: Gynecologic Oncology | Admitting: Gynecologic Oncology

## 2020-10-09 ENCOUNTER — Other Ambulatory Visit: Payer: Self-pay

## 2020-10-09 DIAGNOSIS — Z1501 Genetic susceptibility to malignant neoplasm of breast: Secondary | ICD-10-CM | POA: Diagnosis not present

## 2020-10-09 DIAGNOSIS — Z853 Personal history of malignant neoplasm of breast: Secondary | ICD-10-CM | POA: Diagnosis present

## 2020-10-09 DIAGNOSIS — R971 Elevated cancer antigen 125 [CA 125]: Secondary | ICD-10-CM | POA: Diagnosis not present

## 2020-10-09 DIAGNOSIS — Z1502 Genetic susceptibility to malignant neoplasm of ovary: Secondary | ICD-10-CM | POA: Insufficient documentation

## 2020-10-09 DIAGNOSIS — Z1509 Genetic susceptibility to other malignant neoplasm: Secondary | ICD-10-CM | POA: Insufficient documentation

## 2020-10-10 LAB — CA 125: Cancer Antigen (CA) 125: 5 U/mL (ref 0.0–38.1)

## 2021-04-28 ENCOUNTER — Inpatient Hospital Stay: Payer: Medicare PPO | Attending: Oncology | Admitting: Oncology

## 2021-04-28 ENCOUNTER — Other Ambulatory Visit: Payer: Self-pay

## 2021-04-28 VITALS — BP 118/75 | HR 95 | Temp 98.2°F | Resp 18 | Ht 61.0 in | Wt 165.0 lb

## 2021-04-28 DIAGNOSIS — C50919 Malignant neoplasm of unspecified site of unspecified female breast: Secondary | ICD-10-CM | POA: Diagnosis not present

## 2021-04-28 DIAGNOSIS — Z853 Personal history of malignant neoplasm of breast: Secondary | ICD-10-CM | POA: Diagnosis present

## 2021-04-28 DIAGNOSIS — Z9013 Acquired absence of bilateral breasts and nipples: Secondary | ICD-10-CM | POA: Insufficient documentation

## 2021-04-28 DIAGNOSIS — Z1501 Genetic susceptibility to malignant neoplasm of breast: Secondary | ICD-10-CM | POA: Diagnosis not present

## 2021-04-28 DIAGNOSIS — Z9221 Personal history of antineoplastic chemotherapy: Secondary | ICD-10-CM | POA: Diagnosis not present

## 2021-04-28 NOTE — Progress Notes (Signed)
  [place] OFFICE PROGRESS NOTE   Diagnosis: Breast cancer  INTERVAL HISTORY:   Toni Schwartz returns as scheduled.  She feels well.  No change over the chest wall.  She has developed "floaters "in the eyes.  She declined oophorectomy last year.  Objective:  Vital signs in last 24 hours:  Blood pressure 118/75, pulse 95, temperature 98.2 F (36.8 C), temperature source Oral, resp. rate 18, height $RemoveBe'5\' 1"'XrvJnROAe$  (1.549 m), weight 165 lb (74.8 kg), SpO2 99 %.    Lymphatics: No cervical, supraclavicular, or axillary nodes Resp: Lungs clear bilaterally Cardio: Regular rate and rhythm GI: No hepatosplenomegaly, nontender, no mass Vascular: No leg edema Breast: Status post bilateral mastectomy with TRAM reconstructions.  No evidence for chest wall tumor recurrence  Lab Results:  Lab Results  Component Value Date   WBC 4.8 09/08/2006   HGB 11.2 (L) 09/08/2006   HCT 32.6 (L) 09/08/2006   MCV 91.0 09/08/2006   PLT 259 09/08/2006   NEUTROABS 3.0 09/08/2006    CMP  Lab Results  Component Value Date   NA 138 07/21/2006   K 4.1 07/21/2006   CL 104 07/21/2006   CO2 25 07/21/2006   GLUCOSE 98 07/21/2006   BUN 16 07/21/2006   CREATININE 0.83 07/21/2006   CALCIUM 9.5 07/21/2006   PROT 6.7 07/21/2006   ALBUMIN 4.3 07/21/2006   AST 16 07/21/2006   ALT 25 07/21/2006   ALKPHOS 79 07/21/2006   BILITOT 0.2 (L) 07/21/2006    Medications: I have reviewed the patient's current medications.   Assessment/Plan: Stage I right-sided breast cancer status post 4 cycles of adjuvant AC chemotherapy. She began Arimidex in February 2008, completed at the end of January 2013. 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 Bilateral mastectomies and TRAM reconstruction in April 2008. BRCA2 heterozygous     Disposition: Toni Schwartz is in remission from breast cancer.  She would like to  continue follow-up at the Cancer center.  She will return for an office visit in 1 year.  She is a BRCA2 carrier.  We discussed the risk of ovarian cancer and recommendation for prophylactic oophorectomy.  She does not wish to undergo an oophorectomy at present.  She declined a referral to GYN oncology.    Betsy Coder, MD  04/28/2021  9:35 AM

## 2021-06-11 IMAGING — US US PELVIS COMPLETE WITH TRANSVAGINAL
1 series · 13 of 25 positions shown · non-contrast
Comparison: MRI 05/28/2020

CLINICAL DATA: BRCA 2 mutation, ovarian cyst

EXAM:
TRANSABDOMINAL AND TRANSVAGINAL ULTRASOUND OF PELVIS
TECHNIQUE: Both transabdominal and transvaginal ultrasound examinations of the
pelvis were performed. Transabdominal technique was performed for
global imaging of the pelvis including uterus, ovaries, adnexal
regions, and pelvic cul-de-sac. It was necessary to proceed with
endovaginal exam following the transabdominal exam to visualize the
ovaries bilaterally as well as the endometrium.

[Series 1: us pelvis complete with transvaginal · 13 of 76 slices shown]
[im 1/76]
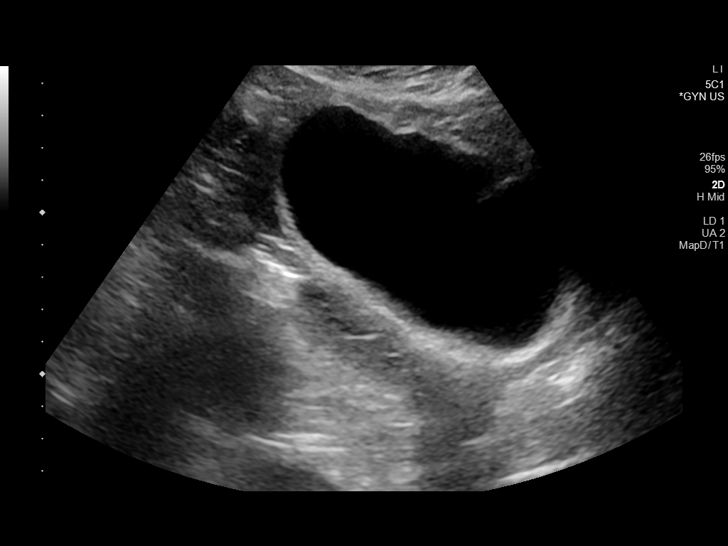
[im 7/76]
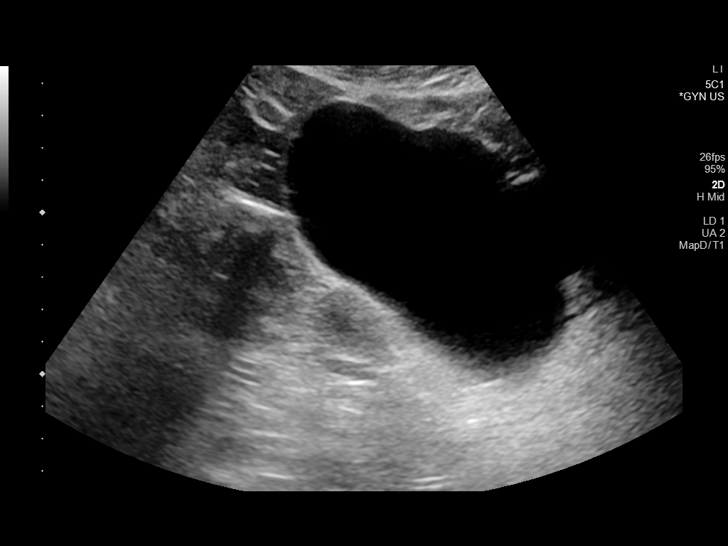
[im 13/76]
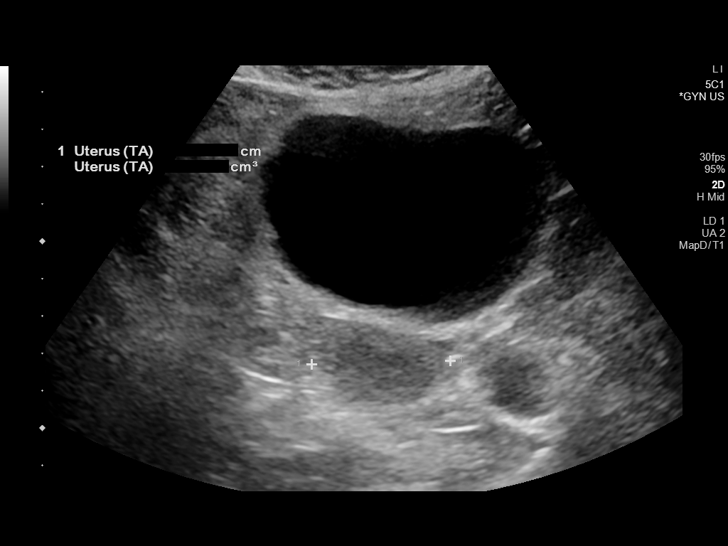
[im 19/76]
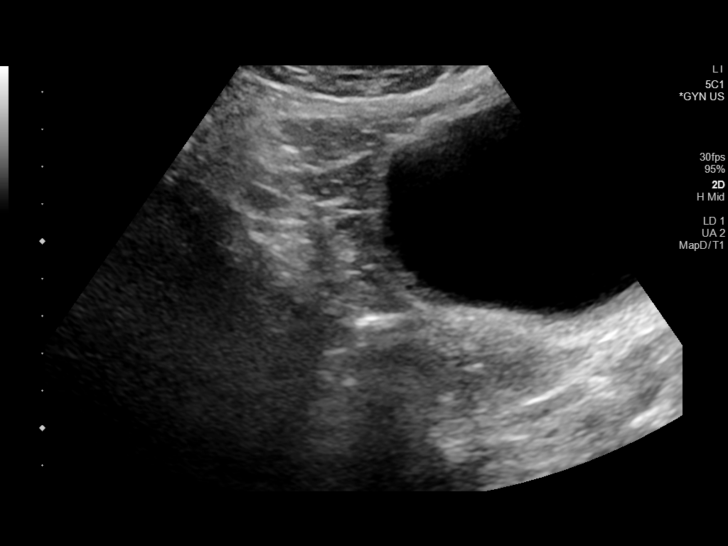
[im 26/76]
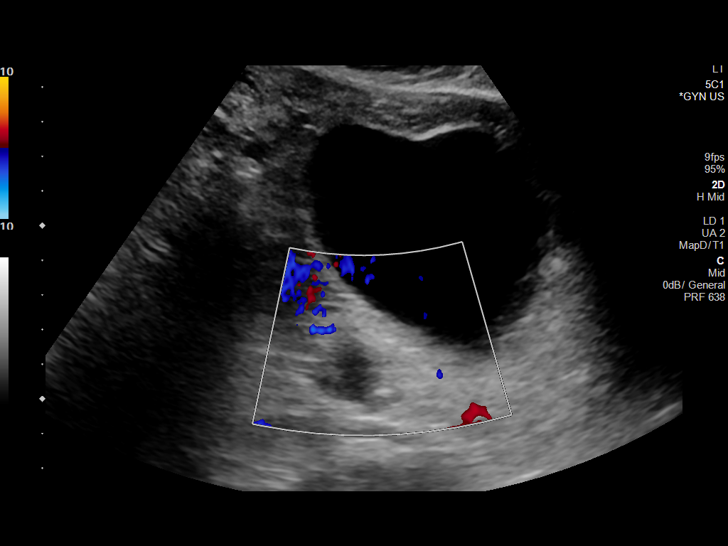
[im 32/76]
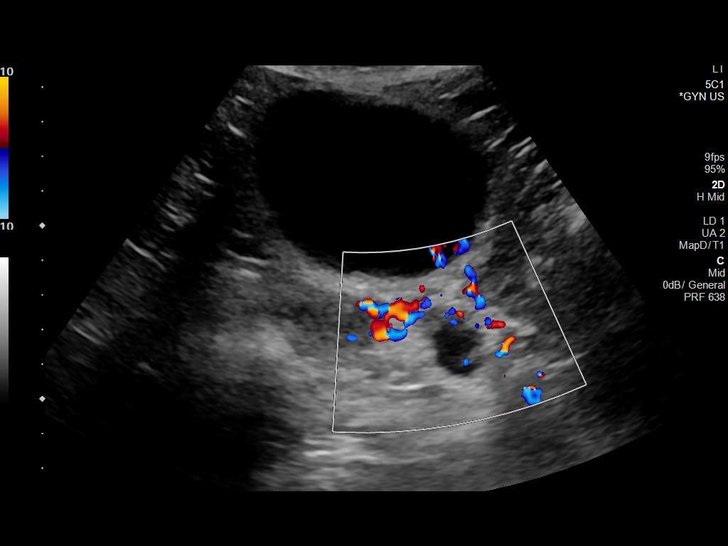
[im 38/76]
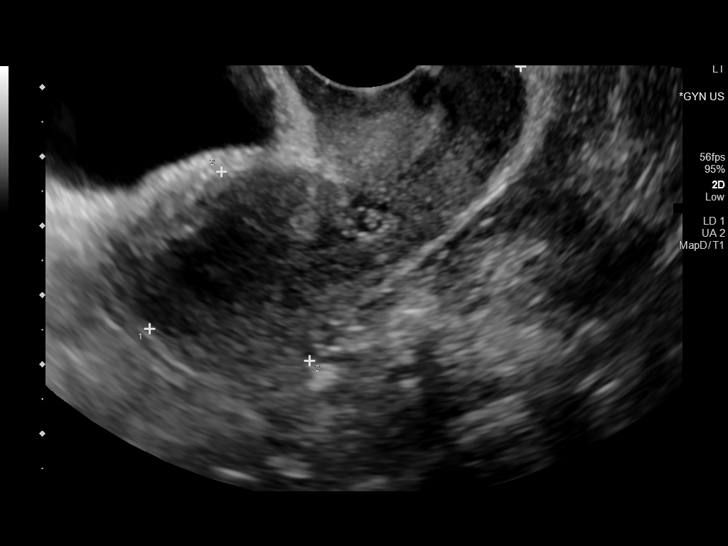
[im 44/76]
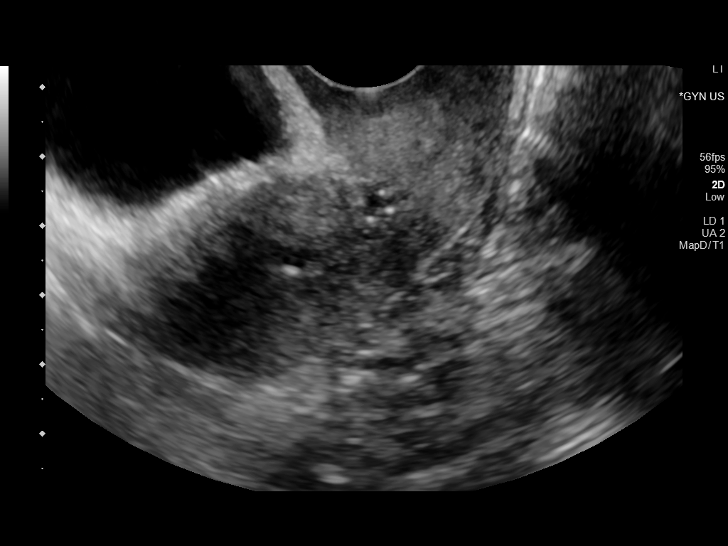
[im 51/76]
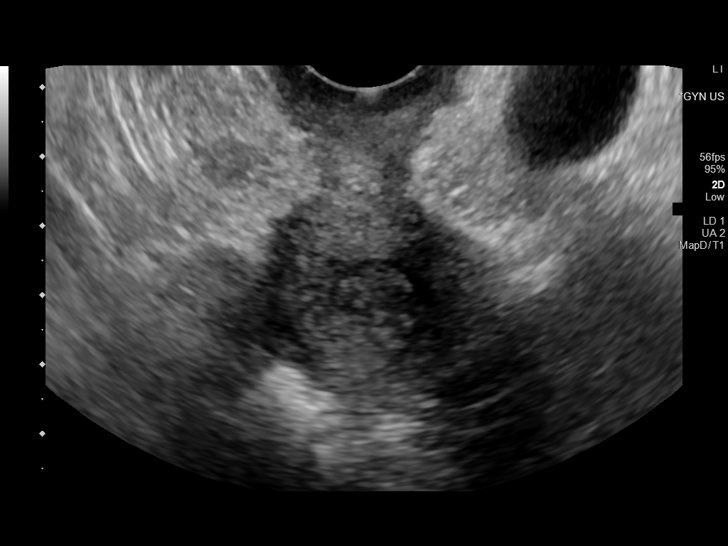
[im 57/76]
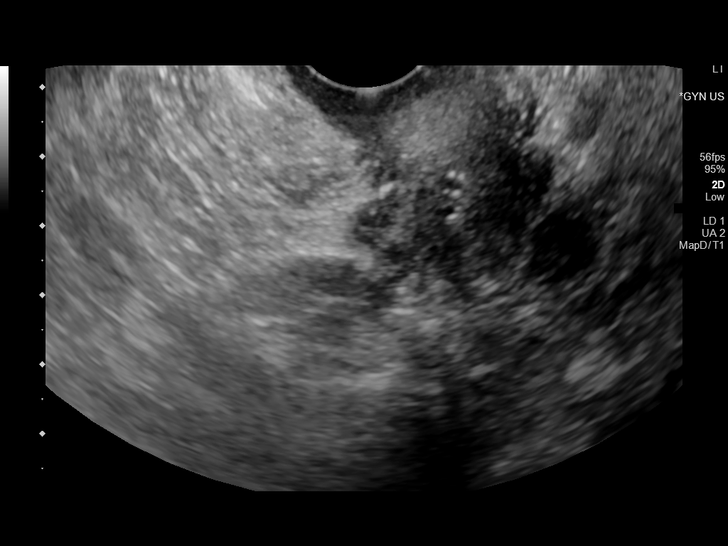
[im 63/76]
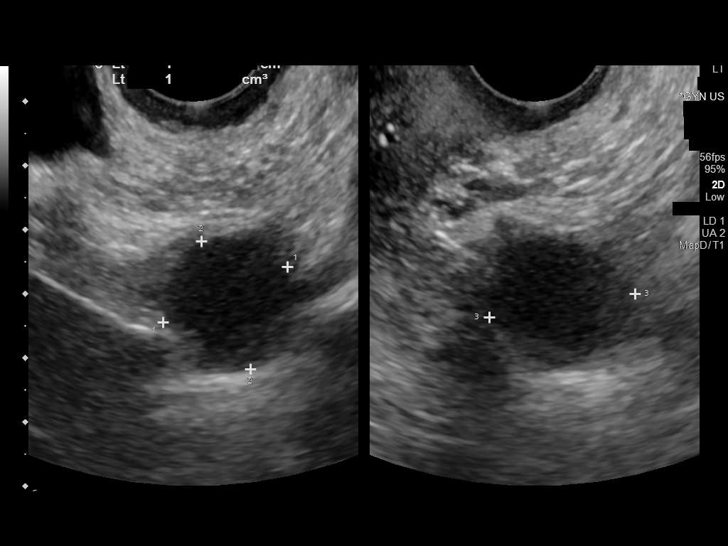
[im 69/76]
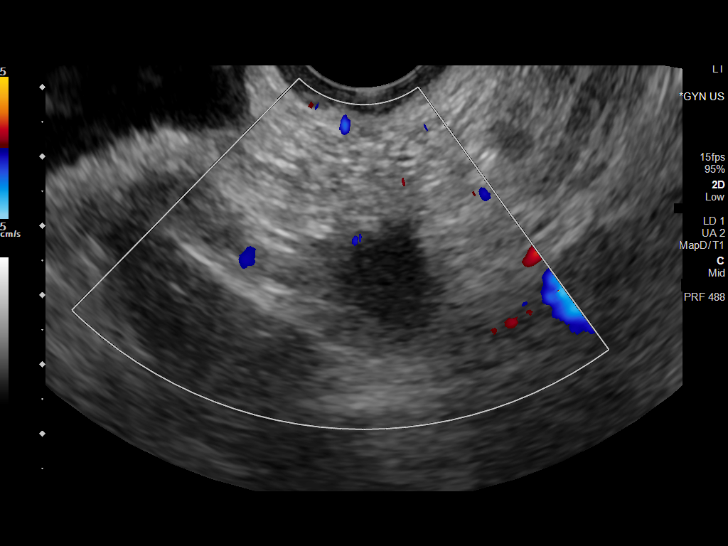
[im 76/76]
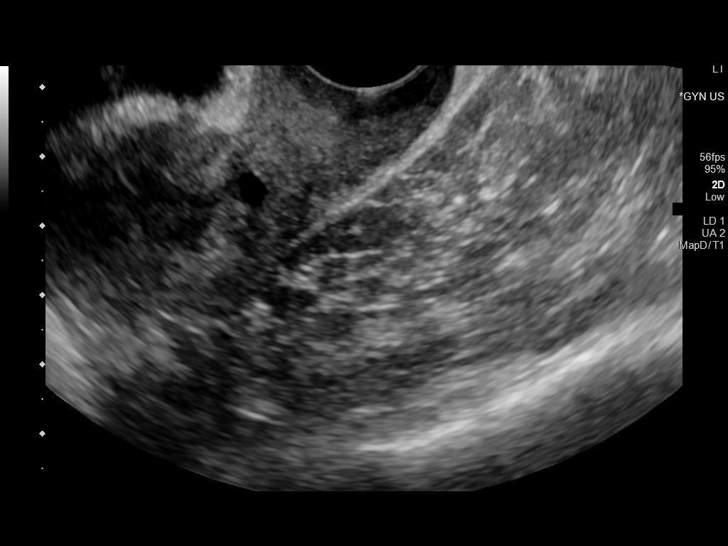

[13 of 25 positions shown; findings below may reference images not displayed]

FINDINGS: Uterus

Measurements: 6.6 x 3.0 x 3.3 cm = volume: 34 mL. No intrauterine
masses are identified. A 8 mm simple cyst is seen within the lower
uterine segment within the a submucosal myometrium, nonspecific, but
stable since prior MRI examination and likely benign. The cervix is
unremarkable.

Endometrium

Thickness: 2 mm.  No focal abnormality visualized.

Right ovary

Not visualized.  No adnexal mass.

Left ovary

Measurements: 2.6 x 2.3 x 2.9 cm = volume: 9 mL. The left ovary
contains a 2.1 x 2.1 x 2.3 cm simple cyst, stable since prior MRI
examination.

Other findings

Trace free fluid is seen within the cul-de-sac.
IMPRESSION: Stable simple cyst within the left ovary, likely benign given its
appearance on prior MRI examination and stability over time.

## 2022-04-28 ENCOUNTER — Inpatient Hospital Stay: Payer: Medicare PPO | Attending: Oncology | Admitting: Oncology

## 2022-04-28 VITALS — BP 110/68 | HR 82 | Temp 98.2°F | Resp 20 | Ht 61.0 in | Wt 164.8 lb

## 2022-04-28 DIAGNOSIS — Z853 Personal history of malignant neoplasm of breast: Secondary | ICD-10-CM | POA: Diagnosis present

## 2022-04-28 DIAGNOSIS — Z923 Personal history of irradiation: Secondary | ICD-10-CM | POA: Insufficient documentation

## 2022-04-28 DIAGNOSIS — C50919 Malignant neoplasm of unspecified site of unspecified female breast: Secondary | ICD-10-CM | POA: Diagnosis not present

## 2022-04-28 DIAGNOSIS — Z9221 Personal history of antineoplastic chemotherapy: Secondary | ICD-10-CM | POA: Insufficient documentation

## 2022-04-28 DIAGNOSIS — Z1501 Genetic susceptibility to malignant neoplasm of breast: Secondary | ICD-10-CM | POA: Insufficient documentation

## 2022-04-28 NOTE — Progress Notes (Signed)
  Toni [facility] OFFICE PROGRESS NOTE   Diagnosis: Breast cancer  INTERVAL HISTORY:   Toni Schwartz returns as scheduled.  She feels well.  No change in either chest wall.  No new complaint.  Objective:  Vital signs in last 24 hours:  Blood pressure 110/68, pulse 82, temperature 98.2 F (36.8 C), temperature source Oral, resp. rate 20, height _0  (1.549 m), weight 164 lb 12.8 oz (74.8 kg), SpO2 100 %.    HEENT: Neck without mass Lymphatics: No cervical, supraclavicular, or axillary nodes Resp: Coarse end inspiratory rhonchi at the lower posterior chest bilaterally, no respiratory distress Cardio: Regular rate and rhythm GI: No hepatosplenomegaly Vascular: No leg edema Breast: Status post bilateral mastectomy with TRAM reconstructions.  No evidence for chest wall tumor recurrence.   Lab Results:  Lab Results  Component Value Date   WBC 4.8 09/08/2006   HGB 11.2 (L) 09/08/2006   HCT 32.6 (L) 09/08/2006   MCV 91.0 09/08/2006   PLT 259 09/08/2006   NEUTROABS 3.0 09/08/2006    CMP  Lab Results  Component Value Date   NA 138 07/21/2006   K 4.1 07/21/2006   CL 104 07/21/2006   CO2 25 07/21/2006   GLUCOSE 98 07/21/2006   BUN 16 07/21/2006   CREATININE 0.83 07/21/2006   CALCIUM 9.5 07/21/2006   PROT 6.7 07/21/2006   ALBUMIN 4.3 07/21/2006   AST 16 07/21/2006   ALT 25 07/21/2006   ALKPHOS 79 07/21/2006   BILITOT 0.2 (L) 07/21/2006    Medications: I have reviewed the patient's current medications.   Assessment/Plan: Stage I right-sided breast cancer status post 4 cycles of adjuvant AC chemotherapy. She began Arimidex in February 2008, completed at the end of January 2013. 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 Bilateral mastectomies and TRAM reconstruction in April 2008. BRCA2 heterozygous     Disposition: Toni Schwartz remains in clinical  remission from breast cancer.  She would like to continue follow-up at the cancer center.  She will return for an office visit in 1 year.  She is a carrier of the BRCA2 mutation.  She has declined prophylactic oophorectomy in the past.  She agrees to a consultation with GYN oncology to consider screening with pelvic ultrasound or other imaging.    Betsy Coder, MD  04/28/2022  10:18 AM

## 2022-04-30 ENCOUNTER — Telehealth: Payer: Self-pay | Admitting: *Deleted

## 2022-04-30 NOTE — Telephone Encounter (Signed)
Spoke with the patient to schedule a 30 est visit for a consult to discuss surgery. Patient stated "I can't come on Mondays or Fridays. Also I can't come 9/19." Explained that the office would talk with the other provider and call her back next week.

## 2022-05-04 NOTE — Telephone Encounter (Signed)
Spoke with the patient and offered an appt with Dr Berline Lopes for 10/10. Patient stated "I can't do Tuesdays. But my husband just said he would make arrangements for a Monday appt."  Patient scheduled 9/18 at 1 pm with Dr Ernestina Patches

## 2022-05-21 ENCOUNTER — Telehealth: Payer: Self-pay | Admitting: *Deleted

## 2022-05-21 NOTE — Telephone Encounter (Signed)
Patient moved her appt from 9/18 to 9/25

## 2022-05-24 ENCOUNTER — Inpatient Hospital Stay: Payer: Medicare PPO | Admitting: Psychiatry

## 2022-05-24 ENCOUNTER — Ambulatory Visit: Payer: Medicare PPO | Admitting: Psychiatry

## 2022-05-28 ENCOUNTER — Encounter: Payer: Self-pay | Admitting: Psychiatry

## 2022-05-31 ENCOUNTER — Encounter: Payer: Self-pay | Admitting: Psychiatry

## 2022-05-31 ENCOUNTER — Inpatient Hospital Stay: Payer: Medicare PPO | Attending: Oncology | Admitting: Psychiatry

## 2022-05-31 ENCOUNTER — Other Ambulatory Visit: Payer: Self-pay

## 2022-05-31 VITALS — BP 117/55 | HR 87 | Temp 98.3°F | Resp 18 | Ht 61.02 in | Wt 166.9 lb

## 2022-05-31 DIAGNOSIS — Z800 Family history of malignant neoplasm of digestive organs: Secondary | ICD-10-CM | POA: Insufficient documentation

## 2022-05-31 DIAGNOSIS — Z1502 Genetic susceptibility to malignant neoplasm of ovary: Secondary | ICD-10-CM

## 2022-05-31 DIAGNOSIS — Z79890 Hormone replacement therapy (postmenopausal): Secondary | ICD-10-CM | POA: Diagnosis not present

## 2022-05-31 DIAGNOSIS — Z1509 Genetic susceptibility to other malignant neoplasm: Secondary | ICD-10-CM

## 2022-05-31 DIAGNOSIS — Z9013 Acquired absence of bilateral breasts and nipples: Secondary | ICD-10-CM | POA: Diagnosis not present

## 2022-05-31 DIAGNOSIS — Z1501 Genetic susceptibility to malignant neoplasm of breast: Secondary | ICD-10-CM | POA: Insufficient documentation

## 2022-05-31 DIAGNOSIS — Z7183 Encounter for nonprocreative genetic counseling: Secondary | ICD-10-CM | POA: Diagnosis not present

## 2022-05-31 DIAGNOSIS — Z853 Personal history of malignant neoplasm of breast: Secondary | ICD-10-CM | POA: Diagnosis present

## 2022-05-31 DIAGNOSIS — Z8041 Family history of malignant neoplasm of ovary: Secondary | ICD-10-CM | POA: Insufficient documentation

## 2022-05-31 DIAGNOSIS — Z8052 Family history of malignant neoplasm of bladder: Secondary | ICD-10-CM | POA: Diagnosis not present

## 2022-05-31 NOTE — Progress Notes (Unsigned)
Gynecologic Oncology Return Clinic Visit  Date of Service: 05/31/2022  Assessment & Plan: Toni Schwartz is a 66 y.o. woman with BRCA2 mutation and personal history of ER positive breast cancer.  Reviewed again with the patient, the risk of ovarian cancer in patients with BRCA 2 mutations is approximately 13-29%. The [employer]  (NCCN) recommends removal of bilateral ovaries and fallopian tubes between the ages of 28-40, and/or when childbearing is completed, to reduce the risk of ovarian and fallopian tube cancer. Preventive surgery to remove the ovaries and fallopian tubes reduces the risk of a related cancer by 80% in women who carry a BRCA1 or BRCA2 mutation.  Women who undergo preventive surgery retain a 4% risk of developing cancer of the peritoneum.   We reviewed that although previously NCCN guidelines included consideration for surveillance with ultrasound and CA125, this recommendation has been moved as the sensitivity is low and does not appear to improve outcomes with the screening.  Would instead recommend regular visits for review of symptoms and exam.   Discussed patient's concerns regarding her question of surgery causing cancer spread and worsening of cancer if present at time of surgery.  Reviewed that we do not have data to suggest that this is the case and in fact BSO reduces her risk of cancer.  Reviewed that this is a same-day surgery with a shorter recovery than a major surgery that would be required for cancer.  Patient would like to consider further.  She will discuss with her husband.  She would not like to pursue this through the end of the year as she reports she has other plans.  Would like to return in 6 months for revisiting this discussion.  RTC 6 months.  Bernadene Bell, MD Gynecologic Oncology   Medical Decision Making I personally spent  TOTAL 31 minutes face-to-face and non-face-to-face in the care of this patient, which includes all  pre, intra, and post visit time on the date of service.  3 minutes spent reviewing records prior to the visit 25 Minutes in patient contact 3 minutes charting , conferring with consultants etc.   ----------------------- Reason for Visit: BRCA2 mutation and a personal history of ER positive breast cancer  Treatment History: Oncology History  Breast cancer [facility])   Initial Diagnosis   Breast cancer ([place])   07/23/2019 Genetic Testing   BRCA2 [id number] pathogenic variant identified on the common hereditary cancer panel.  The Common Hereditary Gene Panel offered by [facility] includes sequencing and/or deletion duplication testing of the following 48 genes: APC, ATM, AXIN2, BARD1, BMPR1A, BRCA1, BRCA2, BRIP1, CDH1, CDK4, CDKN2A (p14ARF), CDKN2A (p16INK4a), CHEK2, CTNNA1, DICER1, EPCAM (Deletion/duplication testing only), GREM1 (promoter region deletion/duplication testing only), KIT, MEN1, MLH1, MSH2, MSH3, MSH6, MUTYH, NBN, NF1, NHTL1, PALB2, PDGFRA, PMS2, POLD1, POLE, PTEN, RAD50, RAD51C, RAD51D, RNF43, SDHB, SDHC, SDHD, SMAD4, SMARCA4. STK11, TP53, TSC1, TSC2, and VHL.  The following genes were evaluated for sequence changes only: SDHA and HOXB13 c.251G>A variant only. The report date is 07/23/2019.     Interval History: Patient was last seen on 05/05/2020 by Dr. Denman George at which time the patient did not wish to proceed immediately with surgery due to commitments and helping her daughter with childcare.  She had a CA125 drawn at that time and repeated again in February 2022 which were normal.  She had a pelvic ultrasound performed which noted a 2 cm left ovarian hypoechoic nodule.  Pelvic MRI was performed which demonstrated a 2.3 cm bilobed cyst.  Repeat  pelvic ultrasound in February 2022 showed a stable cyst.  Patient presents today on the recommendation of her oncologist for reconsideration of risk reducing surgery.  Patient presents today with her spouse.  Patient presents she is overall  feeling well and denies abdominal bloating, early satiety, significant weight loss, change in bowel or bladder habits.  She reports that she is concerned that surgery could make things worse if cancer was present.   Past Medical/Surgical History: Past Medical History:  Diagnosis Date   Breast cancer ([place])    Stage I-right   Family history of bladder cancer    Family history of colon cancer    Family history of ovarian cancer     Past Surgical History:  Procedure Laterality Date   BILATERAL TOTAL MASTECTOMY WITH AXILLARY LYMPH NODE DISSECTION  2008   BREAST LUMPECTOMY     times 2   CESAREAN SECTION     DIAGNOSTIC LAPAROSCOPY  1987   infertility workup    Family History  Problem Relation Age of Onset   Bladder Cancer Father 16       had recurrance in later 52s   Heart Problems Paternal Uncle    Heart failure Maternal Grandmother    COPD Maternal Grandfather    Heart disease Paternal Grandmother    Lung disease Paternal Grandfather        cilicosis   Stomach cancer Other        MGM's mother   Colon cancer Cousin        dx in her 24s   Ovarian cancer Cousin        dx in her 86s    Social History   Socioeconomic History   Marital status: Married    Spouse name: Not on file   Number of children: Not on file   Years of education: Not on file   Highest education level: Not on file  Occupational History   Not on file  Tobacco Use   Smoking status: Never   Smokeless tobacco: Never  Vaping Use   Vaping Use: Never used  Substance and Sexual Activity   Alcohol use: Never   Drug use: Never   Sexual activity: Not Currently  Other Topics Concern   Not on file  Social History Narrative   Not on file   Social Determinants of Health   Financial Resource Strain: Not on file  Food Insecurity: Not on file  Transportation Needs: Not on file  Physical Activity: Not on file  Stress: Not on file  Social Connections: Not on file    Current Medications:  Current  Outpatient Medications:    Biotin 5 MG CAPS, Take 20 mg by mouth daily., Disp: , Rfl:    Calcium Citrate-Vitamin D (CITRACAL + D PO), Take 2 tablets by mouth daily. 400/500, Disp: , Rfl:    Cholecalciferol (VITAMIN D3) 1000 UNITS CAPS, Take 1,000 Units by mouth daily., Disp: , Rfl:    Glucosamine-Chondroitin (GLUCOSAMINE CHONDR COMPLEX PO), Take 2 tablets by mouth daily., Disp: , Rfl:    Ibuprofen 200 MG CAPS, Take by mouth., Disp: , Rfl:    levothyroxine (SYNTHROID, LEVOTHROID) 88 MCG tablet, Take 88 mcg by mouth daily before breakfast., Disp: , Rfl:    Multiple Vitamins-Minerals (CENTRUM SILVER PO), Take 1 tablet by mouth daily., Disp: , Rfl:    pantoprazole (PROTONIX) 40 MG tablet, Take 40 mg by mouth., Disp: , Rfl:   Review of Symptoms: Complete 10-system review is negative except as above in  Interval History.  Physical Exam: BP (!) 117/55 (BP Location: Left Arm, Patient Position: Sitting) Comment: informed Dr Ernestina Patches of BP  Pulse 87   Temp 98.3 F (36.8 C) (Oral)   Resp 18   Ht 5' 1.02" (1.55 m)   Wt 166 lb 14.4 oz (75.7 kg)   SpO2 98%   BMI 31.51 kg/m  General: Alert, oriented, no acute distress. HEENT: Normocephalic, atraumatic. Neck symmetric without masses. Sclera anicteric.  Chest: Normal work of breathing.  Abdomen: Soft, nontender.  Well-healed laparoscopic incisions. Extremities: Grossly normal range of motion.  Warm, well perfused.  No edema bilaterally. Skin: No rashes or lesions noted. Lymphatics: No cervical, supraclavicular, or inguinal adenopathy. GU: Normal appearing external genitalia without erythema, excoriation, or lesions.  Speculum exam reveals atrophic vaginal mucosa normal-appearing cervix.  Bimanual exam reveals normal cervix and mobile uterus.  No adnexal masses or tenderness.  Exam chaperoned by Joylene John, NP   Laboratory & Radiologic Studies: Lab Results  Component Value Date   QXI503 5.0 10/09/2020   CAN125 4.7 05/05/2020

## 2022-05-31 NOTE — Patient Instructions (Signed)
It was a pleasure to see you in clinic today. -My recommendation would be to proceed with removal of your fallopian tubes and ovaries. -If we do not proceed with this in the near future, my recommendation would be to return in 6 months for a visit with me. - Return visit planned for 6 months  Thank you very much for allowing me to provide care for you today.  I appreciate your confidence in choosing our Gynecologic Oncology team at [facility].  If you have any questions about your visit today please call our office or send [place] a MyChart message and we will get back to you as soon as possible.

## 2022-06-01 ENCOUNTER — Encounter: Payer: Self-pay | Admitting: Psychiatry

## 2022-08-24 ENCOUNTER — Telehealth: Payer: Self-pay | Admitting: Surgery

## 2022-08-24 NOTE — Telephone Encounter (Signed)
Patient called and stated that after discussing with her husband she feels ready to move forward with surgery and would like to schedule as early as January. Patient advised Dr Ernestina Patches would be notified and our office would reach back out to her to schedule. Patient verbalized understanding and had no further questions.

## 2022-08-27 ENCOUNTER — Telehealth: Payer: Self-pay

## 2022-08-27 NOTE — Telephone Encounter (Signed)
Patient agreed to surgery date of 09/28/22.  Routed to Dr. Ernestina Patches

## 2022-09-03 ENCOUNTER — Telehealth: Payer: Self-pay | Admitting: *Deleted

## 2022-09-03 NOTE — Telephone Encounter (Signed)
LMOM for the patient to call the office back. Patient needs to be scheduled for a pre op appt with both Dr Ernestina Patches and Lenna Sciara APP

## 2022-09-07 ENCOUNTER — Telehealth: Payer: Self-pay | Admitting: *Deleted

## 2022-09-07 NOTE — Telephone Encounter (Signed)
Patient sated that she tested positive for COVID on 12/27

## 2022-09-07 NOTE — Telephone Encounter (Signed)
Spoke with the patient and moved her pre op appt at [facility] to 1/15 after her appts with Dr Ernestina Patches and Lenna Sciara APP

## 2022-09-16 ENCOUNTER — Encounter (HOSPITAL_COMMUNITY): Payer: Medicare PPO

## 2022-09-17 NOTE — Progress Notes (Signed)
Anesthesia Review:  PCP: Alonna Buckler LOV 05-13-22 MD passed away will be transferred to another MD in [facility]- has not yet done so  Cardiologist : none  Chest x-ray : EKG : Echo : Stress test: Cardiac Cath :  Activity level: can do a flight of stairs without difficulty  Sleep Study/ CPAP : none  Fasting Blood Sugar :      / Checks Blood Sugar -- times a day:   Blood Thinner/ Instructions /Last Dose: ASA / Instructions/ Last Dose :    Positive for Covid on 09/01/22 per pt

## 2022-09-17 NOTE — Patient Instructions (Signed)
SURGICAL WAITING ROOM VISITATION  Patients having surgery or a procedure may have no more than 2 support people in the waiting area - these visitors may rotate.    Children under the age of 69 must have an adult with them who is not the patient.  Due to an increase in RSV and influenza rates and associated hospitalizations, children ages 17 and under may not visit patients in [place].  If the patient needs to stay at the hospital during part of their recovery, the visitor guidelines for inpatient rooms apply. Pre-op nurse will coordinate an appropriate time for 1 support person to accompany patient in pre-op.  This support person may not rotate.    Please refer to the [facility] website for the visitor guidelines for Inpatients (after your surgery is over and you are in a regular room).       Your procedure is scheduled on:  09/28/22    Report to [facility] Main Entrance    Report to admitting at   [phone]   Call this number if you have problems the morning of surgery [phone]   Do not eat food :After Midnight.              EAt a light diet the day before surgery.    After Midnight you may have the following liquids until __ 0430____ AM OF SURGERY  Water Non-Citrus Juices (without pulp, NO RED-Apple, White grape, White cranberry) Black Coffee (NO MILK/CREAM OR CREAMERS, sugar ok)  Clear Tea (NO MILK/CREAM OR CREAMERS, sugar ok) regular and decaf                             Plain Jell-O (NO RED)                                           Fruit ices (not with fruit pulp, NO RED)                                     Popsicles (NO RED)                                                               Sports drinks like Gatorade (NO RED)              .              If you have questions, please contact your surgeon's office.     Oral Hygiene is also important to reduce your risk of infection.                                    Remember - BRUSH YOUR TEETH  THE MORNING OF SURGERY WITH YOUR REGULAR TOOTHPASTE  DENTURES WILL BE REMOVED PRIOR TO SURGERY PLEASE DO NOT APPLY "Poly grip" OR ADHESIVES!!!   Do NOT smoke after Midnight   Take these medicines the morning of surgery with A SIP OF WATER:  synthroid, protonix  DO NOT TAKE ANY ORAL DIABETIC MEDICATIONS DAY OF YOUR SURGERY  Bring CPAP mask and tubing day of surgery.                              You may not have any metal on your body including hair pins, jewelry, and body piercing             Do not wear make-up, lotions, powders, perfumes/cologne, or deodorant  Do not wear nail polish including gel and S&S, artificial/acrylic nails, or any other type of covering on natural nails including finger and toenails. If you have artificial nails, gel coating, etc. that needs to be removed by a nail salon please have this removed prior to surgery or surgery may need to be canceled/ delayed if the surgeon/ anesthesia feels like they are unable to be safely monitored.   Do not shave  48 hours prior to surgery.               Men may shave face and neck.   Do not bring valuables to the hospital. [place].   Contacts, glasses, dentures or bridgework may not be worn into surgery.   Bring small overnight bag day of surgery.   DO NOT [place]. PHARMACY WILL DISPENSE MEDICATIONS LISTED ON YOUR MEDICATION LIST TO YOU DURING YOUR ADMISSION [place]!    Patients discharged on the day of surgery will not be allowed to drive home.  Someone NEEDS to stay with you for the first 24 hours after anesthesia.   Special Instructions: Bring a copy of your healthcare power of attorney and living will documents the day of surgery if you haven't scanned them before.              Please read over the following fact sheets you were given: IF [place] [phone]   If  you received a COVID test during your pre-op visit  it is requested that you wear a mask when out in public, stay away from anyone that may not be feeling well and notify your surgeon if you develop symptoms. If you test positive for Covid or have been in contact with anyone that has tested positive in the last 10 days please notify you surgeon.    [facility] - Preparing for Surgery Before surgery, you can play an important role.  Because skin is not sterile, your skin needs to be as free of germs as possible.  You can reduce the number of germs on your skin by washing with CHG (chlorahexidine gluconate) soap before surgery.  CHG is an antiseptic cleaner which kills germs and bonds with the skin to continue killing germs even after washing. Please DO NOT use if you have an allergy to CHG or antibacterial soaps.  If your skin becomes reddened/irritated stop using the CHG and inform your nurse when you arrive at Short Stay. Do not shave (including legs and underarms) for at least 48 hours prior to the first CHG shower.  You may shave your face/neck. Please follow these instructions carefully:  1.  Shower with CHG Soap the night before surgery and the  morning of Surgery.  2.  If you choose to wash your hair, wash your hair first as usual  with your  normal  shampoo.  3.  After you shampoo, rinse your hair and body thoroughly to remove the  shampoo.                           4.  Use CHG as you would any other liquid soap.  You can apply chg directly  to the skin and wash                       Gently with a scrungie or clean washcloth.  5.  Apply the CHG Soap to your body ONLY FROM THE NECK DOWN.   Do not use on face/ open                           Wound or open sores. Avoid contact with eyes, ears mouth and genitals (private parts).                       Wash face,  Genitals (private parts) with your normal soap.             6.  Wash thoroughly, paying special attention to the area where your surgery   will be performed.  7.  Thoroughly rinse your body with warm water from the neck down.  8.  DO NOT shower/wash with your normal soap after using and rinsing off  the CHG Soap.                9.  Pat yourself dry with a clean towel.            10.  Wear clean pajamas.            11.  Place clean sheets on your bed the night of your first shower and do not  sleep with pets. Day of Surgery : Do not apply any lotions/deodorants the morning of surgery.  Please wear clean clothes to the hospital/surgery center.  FAILURE TO FOLLOW THESE INSTRUCTIONS MAY RESULT IN THE CANCELLATION OF YOUR SURGERY PATIENT SIGNATURE_________________________________  NURSE SIGNATURE__________________________________  ________________________________________________________________________

## 2022-09-20 ENCOUNTER — Inpatient Hospital Stay (HOSPITAL_BASED_OUTPATIENT_CLINIC_OR_DEPARTMENT_OTHER): Payer: Medicare PPO | Admitting: Psychiatry

## 2022-09-20 ENCOUNTER — Inpatient Hospital Stay: Payer: Medicare PPO | Attending: Gynecologic Oncology | Admitting: Gynecologic Oncology

## 2022-09-20 ENCOUNTER — Encounter (HOSPITAL_COMMUNITY): Payer: Self-pay

## 2022-09-20 ENCOUNTER — Other Ambulatory Visit: Payer: Self-pay

## 2022-09-20 ENCOUNTER — Encounter (HOSPITAL_COMMUNITY)
Admission: RE | Admit: 2022-09-20 | Discharge: 2022-09-20 | Disposition: A | Discharge status: Home / Self Care | Payer: Medicare PPO | Source: Ambulatory Visit | Attending: Psychiatry | Admitting: Psychiatry

## 2022-09-20 VITALS — BP 114/64 | HR 101 | Temp 98.9°F | Resp 14 | Wt 167.6 lb

## 2022-09-20 DIAGNOSIS — Z1509 Genetic susceptibility to other malignant neoplasm: Secondary | ICD-10-CM | POA: Insufficient documentation

## 2022-09-20 DIAGNOSIS — Z148 Genetic carrier of other disease: Secondary | ICD-10-CM

## 2022-09-20 DIAGNOSIS — Z1502 Genetic susceptibility to malignant neoplasm of ovary: Secondary | ICD-10-CM | POA: Insufficient documentation

## 2022-09-20 DIAGNOSIS — Z01812 Encounter for preprocedural laboratory examination: Secondary | ICD-10-CM | POA: Insufficient documentation

## 2022-09-20 DIAGNOSIS — Z1501 Genetic susceptibility to malignant neoplasm of breast: Secondary | ICD-10-CM | POA: Diagnosis not present

## 2022-09-20 DIAGNOSIS — Z9013 Acquired absence of bilateral breasts and nipples: Secondary | ICD-10-CM | POA: Insufficient documentation

## 2022-09-20 DIAGNOSIS — Z853 Personal history of malignant neoplasm of breast: Secondary | ICD-10-CM | POA: Insufficient documentation

## 2022-09-20 HISTORY — DX: Gastro-esophageal reflux disease without esophagitis: K21.9

## 2022-09-20 HISTORY — DX: Hypothyroidism, unspecified: E03.9

## 2022-09-20 LAB — CBC
HCT: 42.5 % (ref 36.0–46.0)
Hemoglobin: 13.2 g/dL (ref 12.0–15.0)
MCH: 30.6 pg (ref 26.0–34.0)
MCHC: 31.1 g/dL (ref 30.0–36.0)
MCV: 98.4 fL (ref 80.0–100.0)
Platelets: 247 10*3/uL (ref 150–400)
RBC: 4.32 MIL/uL (ref 3.87–5.11)
RDW: 13.5 % (ref 11.5–15.5)
WBC: 8.1 10*3/uL (ref 4.0–10.5)
nRBC: 0 % (ref 0.0–0.2)

## 2022-09-20 LAB — COMPREHENSIVE METABOLIC PANEL
ALT: 26 U/L (ref 0–44)
AST: 25 U/L (ref 15–41)
Albumin: 4 g/dL (ref 3.5–5.0)
Alkaline Phosphatase: 70 U/L (ref 38–126)
Anion gap: 8 (ref 5–15)
BUN: 15 mg/dL (ref 8–23)
CO2: 26 mmol/L (ref 22–32)
Calcium: 9 mg/dL (ref 8.9–10.3)
Chloride: 106 mmol/L (ref 98–111)
Creatinine, Ser: 1.03 mg/dL — ABNORMAL HIGH (ref 0.44–1.00)
GFR, Estimated: 59 mL/min — ABNORMAL LOW (ref >60)
Glucose, Bld: 96 mg/dL (ref 70–99)
Potassium: 4.6 mmol/L (ref 3.5–5.1)
Sodium: 140 mmol/L (ref 135–145)
Total Bilirubin: 0.5 mg/dL (ref 0.3–1.2)
Total Protein: 7.1 g/dL (ref 6.5–8.1)

## 2022-09-20 MED ORDER — SENNOSIDES-DOCUSATE SODIUM 8.6-50 MG PO TABS: 2.0000 | ORAL_TABLET | Freq: Every day | ORAL | 0 refills | Status: DC

## 2022-09-20 MED ORDER — TRAMADOL HCL 50 MG PO TABS: 50.0000 mg | ORAL_TABLET | Freq: Four times a day (QID) | ORAL | 0 refills | Status: DC | PRN

## 2022-09-20 NOTE — H&P (View-Only) (Signed)
Gynecologic Oncology Return Clinic Visit  Date of Service: 09/20/2022 Referring Provider:  Dr. Benay Spice   Assessment & Plan: Toni Schwartz is a 67 y.o. woman with BRCA2 mutation and personal history of ER positive breast cancer.  Reviewed NCCN guidelines and recommendation for risk-reducing BSO. Pt ready to proceed with risk reducing surgery at this time.  Reviewed risks/benefits of hysterectomy at time of surgery. Given pt's age and history of breast cancer, would not recommend postop HRT. Also, denies a history of PMB or high grade cervical dysplasia. So do not feel that patient requires hysterectomy. Pt agrees and would like to proceed with BSO only.   Patient was consented for: Robotic assisted BSO on 09/28/22.  The risks of surgery were discussed in detail and she understands these to including but not limited to bleeding requiring a blood transfusion, infection, injury to adjacent organs (including but not limited to the bowels, bladder, ureters, nerves, blood vessels), thromboembolic events, wound separation, hernia, unforseen complication, and possible need for re-exploration.  If the patient experiences any of these events, she understands that her hospitalization or recovery may be prolonged and that she may need to take additional medications for a prolonged period. The patient will receive DVT and antibiotic prophylaxis as indicated. She voiced a clear understanding. She had the opportunity to ask questions and verbal informed consent was obtained today. She wishes to proceed.  She does not require preoperative clearance. Her METs are >4.  All preoperative instructions were reviewed. Postoperative expectations were also reviewed.   RTC postop.  Bernadene Bell, MD Gynecologic Oncology   Medical Decision Making I personally spent  TOTAL 25 minutes face-to-face and non-face-to-face in the care of this patient, which includes all pre, intra, and post visit time on the date of  service   ----------------------- Reason for Visit: Treatment counseling  Treatment History: Oncology History  Breast cancer [facility])   Initial Diagnosis   Breast cancer ([place])   07/23/2019 Genetic Testing   BRCA2 [id number] pathogenic variant identified on the common hereditary cancer panel.  The Common Hereditary Gene Panel offered by [facility] includes sequencing and/or deletion duplication testing of the following 48 genes: APC, ATM, AXIN2, BARD1, BMPR1A, BRCA1, BRCA2, BRIP1, CDH1, CDK4, CDKN2A (p14ARF), CDKN2A (p16INK4a), CHEK2, CTNNA1, DICER1, EPCAM (Deletion/duplication testing only), GREM1 (promoter region deletion/duplication testing only), KIT, MEN1, MLH1, MSH2, MSH3, MSH6, MUTYH, NBN, NF1, NHTL1, PALB2, PDGFRA, PMS2, POLD1, POLE, PTEN, RAD50, RAD51C, RAD51D, RNF43, SDHB, SDHC, SDHD, SMAD4, SMARCA4. STK11, TP53, TSC1, TSC2, and VHL.  The following genes were evaluated for sequence changes only: SDHA and HOXB13 c.251G>A variant only. The report date is 07/23/2019.     Interval History: Pt presents today with her husband. She reports that she has been overall doing well since I saw her last. She denies any postmenopausal bleeding. Last pap was 01/23/19 and NILM, HPV neg. Denies history of high grade cervical dysplasia.    Past Medical/Surgical History: Past Medical History:  Diagnosis Date   Breast cancer ([place])    Stage I-right   Family history of bladder cancer    Family history of colon cancer    Family history of ovarian cancer    GERD (gastroesophageal reflux disease)    Hypothyroidism     Past Surgical History:  Procedure Laterality Date   BILATERAL TOTAL MASTECTOMY WITH AXILLARY LYMPH NODE DISSECTION  2008   BREAST LUMPECTOMY     times 2   CESAREAN SECTION     DIAGNOSTIC LAPAROSCOPY  1987   infertility workup  Family History  Problem Relation Age of Onset   Bladder Cancer Father 75       had recurrance in later 42s   Heart Problems Paternal Uncle    Heart  failure Maternal Grandmother    COPD Maternal Grandfather    Heart disease Paternal Grandmother    Lung disease Paternal Grandfather        cilicosis   Stomach cancer Other        MGM's mother   Colon cancer Cousin        dx in her 44s   Ovarian cancer Cousin        dx in her 56s    Social History   Socioeconomic History   Marital status: Married    Spouse name: Not on file   Number of children: Not on file   Years of education: Not on file   Highest education level: Not on file  Occupational History   Not on file  Tobacco Use   Smoking status: Never   Smokeless tobacco: Never  Vaping Use   Vaping Use: Never used  Substance and Sexual Activity   Alcohol use: Never   Drug use: Never   Sexual activity: Not Currently  Other Topics Concern   Not on file  Social History Narrative   Not on file   Social Determinants of Health   Financial Resource Strain: Not on file  Food Insecurity: Not on file  Transportation Needs: Not on file  Physical Activity: Not on file  Stress: Not on file  Social Connections: Not on file    Current Medications:  Current Outpatient Medications:    Biotin 5 MG CAPS, Take 20 mg by mouth daily., Disp: , Rfl:    Calcium Citrate-Vitamin D (CITRACAL + D PO), Take 2 tablets by mouth daily., Disp: , Rfl:    Cholecalciferol (VITAMIN D3) 1000 UNITS CAPS, Take 1,000 Units by mouth daily., Disp: , Rfl:    Glucosamine-Chondroitin (GLUCOSAMINE CHONDR COMPLEX PO), Take 2 tablets by mouth daily., Disp: , Rfl:    levothyroxine (SYNTHROID, LEVOTHROID) 88 MCG tablet, Take 88 mcg by mouth daily before breakfast., Disp: , Rfl:    Multiple Vitamins-Minerals (CENTRUM SILVER PO), Take 1 tablet by mouth daily., Disp: , Rfl:    pantoprazole (PROTONIX) 40 MG tablet, Take 40 mg by mouth., Disp: , Rfl:    senna-docusate (SENOKOT-S) 8.6-50 MG tablet, Take 2 tablets by mouth at bedtime. For AFTER surgery, do not take if having diarrhea, Disp: 30 tablet, Rfl: 0    traMADol (ULTRAM) 50 MG tablet, Take 1 tablet (50 mg total) by mouth every 6 (six) hours as needed for severe pain. For AFTER surgery only, do not take and drive, Disp: 10 tablet, Rfl: 0  Review of Symptoms: Complete 10-system review is negative except as above in Interval History.  Physical Exam: P 114/64 (BP Location: Left Arm, Patient Position: Sitting)  Pulse 101 Important   Temp 98.9 F (37.2 C) (Oral)  Resp 14  Wt 167 lb 9.6 oz (76 kg) SpO2 100%  BMI 31.64 kg/m  BSA 1.81 m General: Alert, oriented, no acute distress. HEENT: Normocephalic, atraumatic. Neck symmetric without masses. Sclera anicteric. Chest: Normal work of breathing. Clear to auscultation bilaterally.   Cardiovascular: Regular rate and rhythm, no murmurs. Abdomen: Soft, nontender.  Normoactive bowel sounds.  No masses or hepatosplenomegaly appreciated.   Extremities: Grossly normal range of motion.  Warm, well perfused.  No edema bilaterally. Skin: No rashes or lesions noted.  Laboratory & Radiologic Studies: none

## 2022-09-20 NOTE — Progress Notes (Signed)
Gynecologic Oncology Return Clinic Visit  Date of Service: 09/20/2022 Referring Provider:  Dr. Benay Spice   Assessment & Plan: Toni Schwartz is a 67 y.o. woman with BRCA2 mutation and personal history of ER positive breast cancer.  Reviewed NCCN guidelines and recommendation for risk-reducing BSO. Pt ready to proceed with risk reducing surgery at this time.  Reviewed risks/benefits of hysterectomy at time of surgery. Given pt's age and history of breast cancer, would not recommend postop HRT. Also, denies a history of PMB or high grade cervical dysplasia. So do not feel that patient requires hysterectomy. Pt agrees and would like to proceed with BSO only.   Patient was consented for: Robotic assisted BSO on 09/28/22.  The risks of surgery were discussed in detail and she understands these to including but not limited to bleeding requiring a blood transfusion, infection, injury to adjacent organs (including but not limited to the bowels, bladder, ureters, nerves, blood vessels), thromboembolic events, wound separation, hernia, unforseen complication, and possible need for re-exploration.  If the patient experiences any of these events, she understands that her hospitalization or recovery may be prolonged and that she may need to take additional medications for a prolonged period. The patient will receive DVT and antibiotic prophylaxis as indicated. She voiced a clear understanding. She had the opportunity to ask questions and verbal informed consent was obtained today. She wishes to proceed.  She does not require preoperative clearance. Her METs are >4.  All preoperative instructions were reviewed. Postoperative expectations were also reviewed.   RTC postop.  Bernadene Bell, MD Gynecologic Oncology   Medical Decision Making I personally spent  TOTAL 25 minutes face-to-face and non-face-to-face in the care of this patient, which includes all pre, intra, and post visit time on the date of  service   ----------------------- Reason for Visit: Treatment counseling  Treatment History: Oncology History  Breast cancer [facility])   Initial Diagnosis   Breast cancer ([place])   07/23/2019 Genetic Testing   BRCA2 [id number] pathogenic variant identified on the common hereditary cancer panel.  The Common Hereditary Gene Panel offered by [facility] includes sequencing and/or deletion duplication testing of the following 48 genes: APC, ATM, AXIN2, BARD1, BMPR1A, BRCA1, BRCA2, BRIP1, CDH1, CDK4, CDKN2A (p14ARF), CDKN2A (p16INK4a), CHEK2, CTNNA1, DICER1, EPCAM (Deletion/duplication testing only), GREM1 (promoter region deletion/duplication testing only), KIT, MEN1, MLH1, MSH2, MSH3, MSH6, MUTYH, NBN, NF1, NHTL1, PALB2, PDGFRA, PMS2, POLD1, POLE, PTEN, RAD50, RAD51C, RAD51D, RNF43, SDHB, SDHC, SDHD, SMAD4, SMARCA4. STK11, TP53, TSC1, TSC2, and VHL.  The following genes were evaluated for sequence changes only: SDHA and HOXB13 c.251G>A variant only. The report date is 07/23/2019.     Interval History: Pt presents today with her husband. She reports that she has been overall doing well since I saw her last. She denies any postmenopausal bleeding. Last pap was 01/23/19 and NILM, HPV neg. Denies history of high grade cervical dysplasia.    Past Medical/Surgical History: Past Medical History:  Diagnosis Date   Breast cancer ([place])    Stage I-right   Family history of bladder cancer    Family history of colon cancer    Family history of ovarian cancer    GERD (gastroesophageal reflux disease)    Hypothyroidism     Past Surgical History:  Procedure Laterality Date   BILATERAL TOTAL MASTECTOMY WITH AXILLARY LYMPH NODE DISSECTION  2008   BREAST LUMPECTOMY     times 2   CESAREAN SECTION     DIAGNOSTIC LAPAROSCOPY  1987   infertility workup  Family History  Problem Relation Age of Onset   Bladder Cancer Father 31       had recurrance in later 87s   Heart Problems Paternal Uncle    Heart  failure Maternal Grandmother    COPD Maternal Grandfather    Heart disease Paternal Grandmother    Lung disease Paternal Grandfather        cilicosis   Stomach cancer Other        MGM's mother   Colon cancer Cousin        dx in her 79s   Ovarian cancer Cousin        dx in her 37s    Social History   Socioeconomic History   Marital status: Married    Spouse name: Not on file   Number of children: Not on file   Years of education: Not on file   Highest education level: Not on file  Occupational History   Not on file  Tobacco Use   Smoking status: Never   Smokeless tobacco: Never  Vaping Use   Vaping Use: Never used  Substance and Sexual Activity   Alcohol use: Never   Drug use: Never   Sexual activity: Not Currently  Other Topics Concern   Not on file  Social History Narrative   Not on file   Social Determinants of Health   Financial Resource Strain: Not on file  Food Insecurity: Not on file  Transportation Needs: Not on file  Physical Activity: Not on file  Stress: Not on file  Social Connections: Not on file    Current Medications:  Current Outpatient Medications:    Biotin 5 MG CAPS, Take 20 mg by mouth daily., Disp: , Rfl:    Calcium Citrate-Vitamin D (CITRACAL + D PO), Take 2 tablets by mouth daily., Disp: , Rfl:    Cholecalciferol (VITAMIN D3) 1000 UNITS CAPS, Take 1,000 Units by mouth daily., Disp: , Rfl:    Glucosamine-Chondroitin (GLUCOSAMINE CHONDR COMPLEX PO), Take 2 tablets by mouth daily., Disp: , Rfl:    levothyroxine (SYNTHROID, LEVOTHROID) 88 MCG tablet, Take 88 mcg by mouth daily before breakfast., Disp: , Rfl:    Multiple Vitamins-Minerals (CENTRUM SILVER PO), Take 1 tablet by mouth daily., Disp: , Rfl:    pantoprazole (PROTONIX) 40 MG tablet, Take 40 mg by mouth., Disp: , Rfl:    senna-docusate (SENOKOT-S) 8.6-50 MG tablet, Take 2 tablets by mouth at bedtime. For AFTER surgery, do not take if having diarrhea, Disp: 30 tablet, Rfl: 0    traMADol (ULTRAM) 50 MG tablet, Take 1 tablet (50 mg total) by mouth every 6 (six) hours as needed for severe pain. For AFTER surgery only, do not take and drive, Disp: 10 tablet, Rfl: 0  Review of Symptoms: Complete 10-system review is negative except as above in Interval History.  Physical Exam: P 114/64 (BP Location: Left Arm, Patient Position: Sitting)  Pulse 101 Important   Temp 98.9 F (37.2 C) (Oral)  Resp 14  Wt 167 lb 9.6 oz (76 kg) SpO2 100%  BMI 31.64 kg/m  BSA 1.81 m General: Alert, oriented, no acute distress. HEENT: Normocephalic, atraumatic. Neck symmetric without masses. Sclera anicteric. Chest: Normal work of breathing. Clear to auscultation bilaterally.   Cardiovascular: Regular rate and rhythm, no murmurs. Abdomen: Soft, nontender.  Normoactive bowel sounds.  No masses or hepatosplenomegaly appreciated.   Extremities: Grossly normal range of motion.  Warm, well perfused.  No edema bilaterally. Skin: No rashes or lesions noted.  Laboratory & Radiologic Studies: none

## 2022-09-20 NOTE — Patient Instructions (Addendum)
Preparing for your Surgery  Plan for surgery on September 28, 2022 with Dr. Bernadene Bell at [place] will be scheduled for robotic assisted laparoscopic bilateral salpingo-oophorectomy (removal of both ovaries and fallopian tubes through small incisions), and any other indicated procedures.   Pre-operative Testing -(Done, scheduled today) You will receive a phone call from presurgical testing at [facility] to arrange for a pre-operative appointment and lab work.  -Bring your insurance card, copy of an advanced directive if applicable, medication list  -At that visit, you will be asked to sign a consent for a possible blood transfusion in case a transfusion becomes necessary during surgery.  The need for a blood transfusion is rare but having consent is a necessary part of your care.     -You should not be taking blood thinners or aspirin at least ten days prior to surgery unless instructed by your surgeon.  -Do not take supplements such as fish oil (omega 3), red yeast rice, turmeric before your surgery. You want to avoid medications with aspirin in them including headache powders such as BC or Goody's), Excedrin migraine.  Day Before Surgery at [place] will be asked to take in a light diet the day before surgery. You will be advised you can have clear liquids up until 3 hours before your surgery.    Eat a light diet the day before surgery.  Examples including soups, broths, toast, yogurt, mashed potatoes.  AVOID GAS PRODUCING FOODS. Things to avoid include carbonated beverages (fizzy beverages, sodas), raw fruits and raw vegetables (uncooked), or beans.   If your bowels are filled with gas, your surgeon will have difficulty visualizing your pelvic organs which increases your surgical risks.  Your role in recovery Your role is to become active as soon as directed by your doctor, while still giving yourself time to heal.  Rest when you feel tired. You will be asked  to do the following in order to speed your recovery:  - Cough and breathe deeply. This helps to clear and expand your lungs and can prevent pneumonia after surgery.  - [place]. Do mild physical activity. Walking or moving your legs help your circulation and body functions return to normal. Do not try to get up or walk alone the first time after surgery.   -If you develop swelling on one leg or the other, pain in the back of your leg, redness/warmth in one of your legs, please call the office or go to the Emergency Room to have a doppler to rule out a blood clot. For shortness of breath, chest pain-seek care in the Emergency Room as soon as possible. - Actively manage your pain. Managing your pain lets you move in comfort. We will ask you to rate your pain on a scale of zero to 10. It is your responsibility to tell your doctor or nurse where and how much you hurt so your pain can be treated.  Special Considerations -If you are diabetic, you may be placed on insulin after surgery to have closer control over your blood sugars to promote healing and recovery.  This does not mean that you will be discharged on insulin.  If applicable, your oral antidiabetics will be resumed when you are tolerating a solid diet.  -Your final pathology results from surgery should be available around one week after surgery and the results will be relayed to you when available.  -Dr. Lahoma Crocker is the surgeon that  assists your GYN Oncologist with surgery.  If you end up staying the night, the next day after your surgery you will either see Dr. Berline Lopes, Dr. Ernestina Patches, or Dr. Lahoma Crocker.  -FMLA forms can be faxed to [phone] and please allow 5-7 business days for completion.  Pain Management After Surgery -You have been prescribed your pain medication and bowel regimen medications before surgery so that you can have these available when you are discharged from the hospital. The pain  medication is for use ONLY AFTER surgery and a new prescription will not be given.   -Make sure that you have Tylenol and Ibuprofen IF YOU ARE ABLE TO TAKE THESE MEDICATIONS at home to use on a regular basis after surgery for pain control. We recommend alternating the medications every hour to six hours since they work differently and are processed in the body differently for pain relief.  -Review the attached handout on narcotic use and their risks and side effects.   Bowel Regimen -You have been prescribed Sennakot-S to take nightly to prevent constipation especially if you are taking the narcotic pain medication intermittently.  It is important to prevent constipation and drink adequate amounts of liquids. You can stop taking this medication when you are not taking pain medication and you are back on your normal bowel routine.  Risks of Surgery Risks of surgery are low but include bleeding, infection, damage to surrounding structures, re-operation, blood clots, and very rarely death.   Blood Transfusion Information (For the consent to be signed before surgery)  We will be checking your blood type before surgery so in case of emergencies, we will know what type of blood you would need.                                            WHAT IS A BLOOD TRANSFUSION?  A transfusion is the replacement of blood or some of its parts. Blood is made up of multiple cells which provide different functions. Red blood cells carry oxygen and are used for blood loss replacement. White blood cells fight against infection. Platelets control bleeding. Plasma helps clot blood. Other blood products are available for specialized needs, such as hemophilia or other clotting disorders. BEFORE THE TRANSFUSION  Who gives blood for transfusions?  You may be able to donate blood to be used at a later date on yourself (autologous donation). Relatives can be asked to donate blood. This is generally not any safer than if  you have received blood from a stranger. The same precautions are taken to ensure safety when a relative's blood is donated. Healthy volunteers who are fully evaluated to make sure their blood is safe. This is blood bank blood. Transfusion therapy is the safest it has ever been in the practice of medicine. Before blood is taken from a donor, a complete history is taken to make sure that person has no history of diseases nor engages in risky social behavior (examples are intravenous drug use or sexual activity with multiple partners). The donor's travel history is screened to minimize risk of transmitting infections, such as malaria. The donated blood is tested for signs of infectious diseases, such as HIV and hepatitis. The blood is then tested to be sure it is compatible with you in order to minimize the chance of a transfusion reaction. If you or a relative donates blood, this is often  done in anticipation of surgery and is not appropriate for emergency situations. It takes many days to process the donated blood. RISKS AND COMPLICATIONS Although transfusion therapy is very safe and saves many lives, the main dangers of transfusion include:  Getting an infectious disease. Developing a transfusion reaction. This is an allergic reaction to something in the blood you were given. Every precaution is taken to prevent this. The decision to have a blood transfusion has been considered carefully by your caregiver before blood is given. Blood is not given unless the benefits outweigh the risks.  AFTER SURGERY INSTRUCTIONS  Return to work: 4-6 weeks if applicable  Activity: 1. Be up and out of the bed during the day.  Take a nap if needed.  You may walk up steps but be careful and use the hand rail.  Stair climbing will tire you more than you think, you may need to stop part way and rest.   2. No lifting or straining for 6 weeks over 10 pounds. No pushing, pulling, straining for 6 weeks.  3. No driving for  around 1 week(s).  Do not drive if you are taking narcotic pain medicine and make sure that your reaction time has returned.   4. You can shower as soon as the next day after surgery. Shower daily.  Use your regular soap and water (not directly on the incision) and pat your incision(s) dry afterwards; don't rub.  No tub baths or submerging your body in water until cleared by your surgeon. If you have the soap that was given to you by pre-surgical testing that was used before surgery, you do not need to use it afterwards because this can irritate your incisions.   5. No sexual activity and nothing in the vagina for 4 weeks.  6. You may experience a small amount of clear drainage from your incisions, which is normal.  If the drainage persists, increases, or changes color please call the office.  7. Do not use creams, lotions, or ointments such as neosporin on your incisions after surgery until advised by your surgeon because they can cause removal of the dermabond glue on your incisions.    8. Take Tylenol or ibuprofen first for pain if you are able to take these medications and only use narcotic pain medication for severe pain not relieved by the Tylenol or Ibuprofen.  Monitor your Tylenol intake to a max of 4,000 mg in a 24 hour period. You can alternate these medications after surgery.  Diet: 1. Low sodium Heart Healthy Diet is recommended but you are cleared to resume your normal (before surgery) diet after your procedure.  2. It is safe to use a laxative, such as Miralax or Colace, if you have difficulty moving your bowels. You have been prescribed Sennakot-S to take at bedtime every evening after surgery to keep bowel movements regular and to prevent constipation.    Wound Care: 1. Keep clean and dry.  Shower daily.  Reasons to call the Doctor: Fever - Oral temperature greater than 100.4 degrees Fahrenheit Foul-smelling vaginal discharge Difficulty urinating Nausea and vomiting Increased  pain at the site of the incision that is unrelieved with pain medicine. Difficulty breathing with or without chest pain New calf pain especially if only on one side Sudden, continuing increased vaginal bleeding with or without clots.   Contacts: For questions or concerns you should contact:  Dr. Bernadene Bell at [place], NP at [phone]  After Hours: call [phone] and  have the GYN Oncologist paged/contacted (after 5 pm or on the weekends).  Messages sent via mychart are for non-urgent matters and are not responded to after hours so for urgent needs, please call the after hours number.

## 2022-09-21 ENCOUNTER — Telehealth: Payer: Self-pay

## 2022-09-21 NOTE — Telephone Encounter (Signed)
Pt aware recent lab results show an increase in creatinine level (1.03) Per Joylene John, NP  Pt aware to stay hydrated and limit NSAIDS. She voiced an understanding.  Results faxed to PCP, Duanne Moron

## 2022-09-25 NOTE — Progress Notes (Signed)
Patient here for follow up with Dr. Ernestina Patches and for a pre-operative appointment prior to her scheduled surgery on September 28, 2022. She is scheduled for robotic BSO and any other indicated procedures.  She has her pre-admission testing appointment later today at [facility].  The surgery was discussed in detail.  See after visit summary for additional details. Visual aids used to discuss items related to surgery.      Discussed post-op pain management in detail including the aspects of the enhanced recovery pathway.  Advised her that a new prescription would be sent in for tramadol and it is only to be used for after her upcoming surgery.  We discussed the use of tylenol post-op and to monitor for a maximum of 4,000 mg in a 24 hour period.  Also prescribed sennakot to be used after surgery and to hold if having loose stools.  Discussed bowel regimen in detail.     Discussed the use of SCDs and measures to take at home to prevent DVT including frequent mobility.  Reportable signs and symptoms of DVT discussed. Post-operative instructions discussed and expectations for after surgery. Incisional care discussed as well including reportable signs and symptoms including erythema, drainage, wound separation.     5 minutes spent with the patient.  Verbalizing understanding of material discussed. No needs or concerns voiced at the end of the visit.   Advised patient and family to call for any needs.  Advised that her post-operative medications had been prescribed and could be picked up at any time.    This appointment is included in the global surgical bundle as pre-operative teaching and has no charge.

## 2022-09-26 ENCOUNTER — Encounter: Payer: Self-pay | Admitting: Psychiatry

## 2022-09-27 ENCOUNTER — Telehealth: Payer: Self-pay | Admitting: *Deleted

## 2022-09-27 NOTE — Telephone Encounter (Signed)
Telephone call to check on pre-operative status.  Patient compliant with pre-operative instructions.  Reinforced nothing to eat after midnight. Clear liquids until 0430. Patient to arrive at 0515.  No questions or concerns voiced.  Instructed to call for any needs.  ?

## 2022-09-27 NOTE — Anesthesia Preprocedure Evaluation (Addendum)
Anesthesia Evaluation  Patient identified by MRN, date of birth, ID band Patient awake    Reviewed: Allergy & Precautions, NPO status , Patient's Chart, lab work & pertinent test results  Airway Mallampati: II  TM Distance: >3 FB Neck ROM: Full    Dental no notable dental hx. (+) Teeth Intact, Dental Advisory Given   Pulmonary neg pulmonary ROS   Pulmonary exam normal breath sounds clear to auscultation       Cardiovascular Exercise Tolerance: Good Normal cardiovascular exam Rhythm:Regular Rate:Normal     Neuro/Psych negative neurological ROS  negative psych ROS   GI/Hepatic ,GERD  ,,  Endo/Other  Hypothyroidism    Renal/GU Lab Results      Component                Value               Date                      CREATININE               1.03 (H)            09/20/2022                BUN                      15                  09/20/2022                NA                       140                 09/20/2022                K                        4.6                 09/20/2022                    Musculoskeletal   Abdominal   Peds  Hematology Lab Results      Component                Value               Date                      WBC                      8.1                 09/20/2022                HGB                      13.2                09/20/2022                HCT                      42.5  09/20/2022                MCV                      98.4                09/20/2022                PLT                      247                 09/20/2022              Anesthesia Other Findings ALL: Sulfa  Hx of breast CA  Reproductive/Obstetrics                             Anesthesia Physical Anesthesia Plan  ASA: 3  Anesthesia Plan: General   Post-op Pain Management: Lidocaine infusion*, Ketamine IV*, Toradol IV (intra-op)* and Tylenol PO (pre-op)*   Induction:  Intravenous  PONV Risk Score and Plan: 4 or greater and Treatment may vary due to age or medical condition, Ondansetron, Midazolam and Dexamethasone  Airway Management Planned: Oral ETT  Additional Equipment: None  Intra-op Plan:   Post-operative Plan: Extubation in OR  Informed Consent: I have reviewed the patients History and Physical, chart, labs and discussed the procedure including the risks, benefits and alternatives for the proposed anesthesia with the patient or authorized representative who has indicated his/her understanding and acceptance.     Dental advisory given  Plan Discussed with: Anesthesiologist, Surgeon and CRNA  Anesthesia Plan Comments:         Anesthesia Quick Evaluation

## 2022-09-28 ENCOUNTER — Encounter (HOSPITAL_COMMUNITY): Payer: Self-pay | Admitting: Psychiatry

## 2022-09-28 ENCOUNTER — Ambulatory Visit (HOSPITAL_BASED_OUTPATIENT_CLINIC_OR_DEPARTMENT_OTHER): Payer: Medicare PPO | Admitting: Anesthesiology

## 2022-09-28 ENCOUNTER — Encounter (HOSPITAL_COMMUNITY)
Admission: RE | Disposition: A | Discharge status: Home / Self Care | Payer: Self-pay | Source: Ambulatory Visit | Attending: Psychiatry

## 2022-09-28 ENCOUNTER — Other Ambulatory Visit: Payer: Self-pay

## 2022-09-28 ENCOUNTER — Ambulatory Visit (HOSPITAL_COMMUNITY): Payer: Medicare PPO | Admitting: Physician Assistant

## 2022-09-28 ENCOUNTER — Ambulatory Visit (HOSPITAL_COMMUNITY)
Admission: RE | Admit: 2022-09-28 | Discharge: 2022-09-28 | Disposition: A | Discharge status: Home / Self Care | Payer: Medicare PPO | Source: Ambulatory Visit | Attending: Psychiatry | Admitting: Psychiatry

## 2022-09-28 DIAGNOSIS — N736 Female pelvic peritoneal adhesions (postinfective): Secondary | ICD-10-CM | POA: Diagnosis not present

## 2022-09-28 DIAGNOSIS — Z1502 Genetic susceptibility to malignant neoplasm of ovary: Secondary | ICD-10-CM | POA: Diagnosis not present

## 2022-09-28 DIAGNOSIS — E039 Hypothyroidism, unspecified: Secondary | ICD-10-CM | POA: Diagnosis not present

## 2022-09-28 DIAGNOSIS — Z4003 Encounter for prophylactic removal of fallopian tube(s): Secondary | ICD-10-CM

## 2022-09-28 DIAGNOSIS — Z1501 Genetic susceptibility to malignant neoplasm of breast: Secondary | ICD-10-CM | POA: Diagnosis not present

## 2022-09-28 DIAGNOSIS — Z79899 Other long term (current) drug therapy: Secondary | ICD-10-CM | POA: Diagnosis not present

## 2022-09-28 DIAGNOSIS — Z8052 Family history of malignant neoplasm of bladder: Secondary | ICD-10-CM | POA: Diagnosis not present

## 2022-09-28 DIAGNOSIS — N83299 Other ovarian cyst, unspecified side: Secondary | ICD-10-CM | POA: Diagnosis not present

## 2022-09-28 DIAGNOSIS — Z1509 Genetic susceptibility to other malignant neoplasm: Secondary | ICD-10-CM | POA: Diagnosis not present

## 2022-09-28 DIAGNOSIS — Z800 Family history of malignant neoplasm of digestive organs: Secondary | ICD-10-CM | POA: Insufficient documentation

## 2022-09-28 DIAGNOSIS — K219 Gastro-esophageal reflux disease without esophagitis: Secondary | ICD-10-CM | POA: Insufficient documentation

## 2022-09-28 DIAGNOSIS — Z853 Personal history of malignant neoplasm of breast: Secondary | ICD-10-CM | POA: Diagnosis not present

## 2022-09-28 DIAGNOSIS — Z170 Estrogen receptor positive status [ER+]: Secondary | ICD-10-CM | POA: Insufficient documentation

## 2022-09-28 DIAGNOSIS — Z8041 Family history of malignant neoplasm of ovary: Secondary | ICD-10-CM | POA: Diagnosis not present

## 2022-09-28 DIAGNOSIS — Z79890 Hormone replacement therapy (postmenopausal): Secondary | ICD-10-CM | POA: Diagnosis not present

## 2022-09-28 DIAGNOSIS — Z148 Genetic carrier of other disease: Secondary | ICD-10-CM

## 2022-09-28 HISTORY — PX: ROBOTIC ASSISTED SALPINGO OOPHERECTOMY: SHX6082

## 2022-09-28 LAB — TYPE AND SCREEN
ABO/RH(D): A POS
Antibody Screen: NEGATIVE

## 2022-09-28 LAB — ABO/RH: ABO/RH(D): A POS

## 2022-09-28 SURGERY — SALPINGO-OOPHORECTOMY, ROBOT-ASSISTED
Anesthesia: General | Laterality: Bilateral

## 2022-09-28 MED ORDER — ONDANSETRON HCL 4 MG/2ML IJ SOLN
INTRAMUSCULAR | Status: DC | PRN
  Administered 2022-09-28: 4 mg via INTRAVENOUS

## 2022-09-28 MED ORDER — LIDOCAINE 2% (20 MG/ML) 5 ML SYRINGE
INTRAMUSCULAR | Status: DC | PRN
  Administered 2022-09-28: 1.5 mg/kg/h via INTRAVENOUS

## 2022-09-28 MED ORDER — CHLORHEXIDINE GLUCONATE 0.12 % MT SOLN
15.0000 mL | Freq: Once | OROMUCOSAL | Status: AC
  Administered 2022-09-28: 15 mL via OROMUCOSAL

## 2022-09-28 MED ORDER — PROPOFOL 10 MG/ML IV BOLUS
INTRAVENOUS | Status: AC
  Filled 2022-09-28: qty 20

## 2022-09-28 MED ORDER — ROCURONIUM BROMIDE 10 MG/ML (PF) SYRINGE
PREFILLED_SYRINGE | INTRAVENOUS | Status: DC | PRN
  Administered 2022-09-28: 60 mg via INTRAVENOUS

## 2022-09-28 MED ORDER — LIDOCAINE HCL 1 % IJ SOLN
INTRAMUSCULAR | Status: AC
  Filled 2022-09-28: qty 20

## 2022-09-28 MED ORDER — LIDOCAINE HCL (PF) 2 % IJ SOLN
INTRAMUSCULAR | Status: AC
  Filled 2022-09-28: qty 5

## 2022-09-28 MED ORDER — KETOROLAC TROMETHAMINE 30 MG/ML IJ SOLN: 30.0000 mg | Freq: Once | INTRAMUSCULAR | Status: DC | PRN

## 2022-09-28 MED ORDER — LACTATED RINGERS IR SOLN
Status: DC | PRN
  Administered 2022-09-28: 1000 mL

## 2022-09-28 MED ORDER — STERILE WATER FOR IRRIGATION IR SOLN
Status: DC | PRN
  Administered 2022-09-28: 1000 mL

## 2022-09-28 MED ORDER — MIDAZOLAM HCL 5 MG/5ML IJ SOLN
INTRAMUSCULAR | Status: DC | PRN
  Administered 2022-09-28: 2 mg via INTRAVENOUS

## 2022-09-28 MED ORDER — BUPIVACAINE HCL 0.25 % IJ SOLN
INTRAMUSCULAR | Status: AC
  Filled 2022-09-28: qty 1

## 2022-09-28 MED ORDER — LACTATED RINGERS IV SOLN: INTRAVENOUS | Status: DC

## 2022-09-28 MED ORDER — PROPOFOL 10 MG/ML IV BOLUS
INTRAVENOUS | Status: DC | PRN
  Administered 2022-09-28: 160 mg via INTRAVENOUS

## 2022-09-28 MED ORDER — OXYCODONE HCL 5 MG/5ML PO SOLN: 5.0000 mg | Freq: Once | ORAL | Status: DC | PRN

## 2022-09-28 MED ORDER — ROCURONIUM BROMIDE 10 MG/ML (PF) SYRINGE
PREFILLED_SYRINGE | INTRAVENOUS | Status: AC
  Filled 2022-09-28: qty 10

## 2022-09-28 MED ORDER — FENTANYL CITRATE (PF) 100 MCG/2ML IJ SOLN
INTRAMUSCULAR | Status: AC
  Filled 2022-09-28: qty 2

## 2022-09-28 MED ORDER — ONDANSETRON HCL 4 MG/2ML IJ SOLN: 4.0000 mg | Freq: Once | INTRAMUSCULAR | Status: DC | PRN

## 2022-09-28 MED ORDER — LIDOCAINE HCL (CARDIAC) PF 100 MG/5ML IV SOSY
PREFILLED_SYRINGE | INTRAVENOUS | Status: DC | PRN
  Administered 2022-09-28: 60 mg via INTRATRACHEAL

## 2022-09-28 MED ORDER — DEXAMETHASONE SODIUM PHOSPHATE 10 MG/ML IJ SOLN
INTRAMUSCULAR | Status: AC
  Filled 2022-09-28: qty 1

## 2022-09-28 MED ORDER — MIDAZOLAM HCL 2 MG/2ML IJ SOLN
INTRAMUSCULAR | Status: AC
  Filled 2022-09-28: qty 2

## 2022-09-28 MED ORDER — HEPARIN SODIUM (PORCINE) 5000 UNIT/ML IJ SOLN
5000.0000 [IU] | INTRAMUSCULAR | Status: AC
  Administered 2022-09-28: 5000 [IU] via SUBCUTANEOUS
  Filled 2022-09-28: qty 1

## 2022-09-28 MED ORDER — HYDROMORPHONE HCL 1 MG/ML IJ SOLN: 0.2500 mg | INTRAMUSCULAR | Status: DC | PRN

## 2022-09-28 MED ORDER — KETAMINE HCL 10 MG/ML IJ SOLN
INTRAMUSCULAR | Status: DC | PRN
  Administered 2022-09-28 (×3): 10 mg via INTRAVENOUS

## 2022-09-28 MED ORDER — PHENYLEPHRINE 80 MCG/ML (10ML) SYRINGE FOR IV PUSH (FOR BLOOD PRESSURE SUPPORT)
PREFILLED_SYRINGE | INTRAVENOUS | Status: DC | PRN
  Administered 2022-09-28 (×2): 160 ug via INTRAVENOUS

## 2022-09-28 MED ORDER — PHENYLEPHRINE 80 MCG/ML (10ML) SYRINGE FOR IV PUSH (FOR BLOOD PRESSURE SUPPORT)
PREFILLED_SYRINGE | INTRAVENOUS | Status: AC
  Filled 2022-09-28: qty 10

## 2022-09-28 MED ORDER — OXYCODONE HCL 5 MG PO TABS: 5.0000 mg | ORAL_TABLET | Freq: Once | ORAL | Status: DC | PRN

## 2022-09-28 MED ORDER — DEXAMETHASONE SODIUM PHOSPHATE 4 MG/ML IJ SOLN
4.0000 mg | INTRAMUSCULAR | Status: AC
  Administered 2022-09-28: 10 mg via INTRAVENOUS

## 2022-09-28 MED ORDER — FENTANYL CITRATE (PF) 100 MCG/2ML IJ SOLN
INTRAMUSCULAR | Status: DC | PRN
  Administered 2022-09-28 (×2): 50 ug via INTRAVENOUS

## 2022-09-28 MED ORDER — ONDANSETRON HCL 4 MG/2ML IJ SOLN
INTRAMUSCULAR | Status: AC
  Filled 2022-09-28: qty 2

## 2022-09-28 MED ORDER — ORAL CARE MOUTH RINSE: 15.0000 mL | Freq: Once | OROMUCOSAL | Status: AC

## 2022-09-28 MED ORDER — KETAMINE HCL 50 MG/5ML IJ SOSY
PREFILLED_SYRINGE | INTRAMUSCULAR | Status: AC
  Filled 2022-09-28: qty 5

## 2022-09-28 MED ORDER — ACETAMINOPHEN 500 MG PO TABS
1000.0000 mg | ORAL_TABLET | ORAL | Status: AC
  Administered 2022-09-28: 1000 mg via ORAL
  Filled 2022-09-28: qty 2

## 2022-09-28 MED ORDER — BUPIVACAINE HCL 0.25 % IJ SOLN
INTRAMUSCULAR | Status: DC | PRN
  Administered 2022-09-28: 26 mL

## 2022-09-28 SURGICAL SUPPLY — 76 items
APPLICATOR SURGIFLO ENDO (HEMOSTASIS) IMPLANT
BAG LAPAROSCOPIC 12 15 PORT 16 (BASKET) IMPLANT
BAG RETRIEVAL 12/15 (BASKET) ×1
BLADE SURG SZ10 CARB STEEL (BLADE) IMPLANT
COVER BACK TABLE 60X90IN (DRAPES) ×1 IMPLANT
COVER TIP SHEARS 8 DVNC (MISCELLANEOUS) ×1 IMPLANT
COVER TIP SHEARS 8MM DA VINCI (MISCELLANEOUS) ×1
DERMABOND ADVANCED .7 DNX12 (GAUZE/BANDAGES/DRESSINGS) ×1 IMPLANT
DRAPE ARM DVNC X/XI (DISPOSABLE) ×4 IMPLANT
DRAPE COLUMN DVNC XI (DISPOSABLE) ×1 IMPLANT
DRAPE DA VINCI XI ARM (DISPOSABLE) ×4
DRAPE DA VINCI XI COLUMN (DISPOSABLE) ×1
DRAPE SHEET LG 3/4 BI-LAMINATE (DRAPES) ×1 IMPLANT
DRAPE SURG IRRIG POUCH 19X23 (DRAPES) ×1 IMPLANT
DRSG OPSITE POSTOP 4X6 (GAUZE/BANDAGES/DRESSINGS) IMPLANT
DRSG OPSITE POSTOP 4X8 (GAUZE/BANDAGES/DRESSINGS) IMPLANT
ELECT PENCIL ROCKER SW 15FT (MISCELLANEOUS) IMPLANT
ELECT REM PT RETURN 15FT ADLT (MISCELLANEOUS) ×1 IMPLANT
GAUZE 4X4 16PLY ~~LOC~~+RFID DBL (SPONGE) ×1 IMPLANT
GLOVE BIO SURGEON STRL SZ 6 (GLOVE) ×4 IMPLANT
GLOVE BIO SURGEON STRL SZ 6.5 (GLOVE) ×1 IMPLANT
GLOVE BIOGEL PI IND STRL 6.5 (GLOVE) ×2 IMPLANT
GOWN STRL REUS W/ TWL LRG LVL3 (GOWN DISPOSABLE) ×4 IMPLANT
GOWN STRL REUS W/TWL LRG LVL3 (GOWN DISPOSABLE) ×4
GRASPER SUT TROCAR 14GX15 (MISCELLANEOUS) IMPLANT
HOLDER FOLEY CATH W/STRAP (MISCELLANEOUS) IMPLANT
IRRIG SUCT STRYKERFLOW 2 WTIP (MISCELLANEOUS) ×1
IRRIGATION SUCT STRKRFLW 2 WTP (MISCELLANEOUS) ×1 IMPLANT
KIT PROCEDURE DA VINCI SI (MISCELLANEOUS)
KIT PROCEDURE DVNC SI (MISCELLANEOUS) IMPLANT
KIT TURNOVER KIT A (KITS) IMPLANT
LIGASURE IMPACT 36 18CM CVD LR (INSTRUMENTS) IMPLANT
MANIPULATOR ADVINCU DEL 3.0 PL (MISCELLANEOUS) IMPLANT
MANIPULATOR ADVINCU DEL 3.5 PL (MISCELLANEOUS) IMPLANT
MANIPULATOR UTERINE 4.5 ZUMI (MISCELLANEOUS) IMPLANT
NDL HYPO 21X1.5 SAFETY (NEEDLE) ×1 IMPLANT
NDL INSUFFLATION 14GA 120MM (NEEDLE) IMPLANT
NDL SPNL 18GX3.5 QUINCKE PK (NEEDLE) IMPLANT
NEEDLE HYPO 21X1.5 SAFETY (NEEDLE) ×1 IMPLANT
NEEDLE INSUFFLATION 14GA 120MM (NEEDLE) IMPLANT
NEEDLE SPNL 18GX3.5 QUINCKE PK (NEEDLE) IMPLANT
OBTURATOR OPTICAL STANDARD 8MM (TROCAR) ×1
OBTURATOR OPTICAL STND 8 DVNC (TROCAR) ×1
OBTURATOR OPTICALSTD 8 DVNC (TROCAR) ×1 IMPLANT
PACK ROBOT GYN CUSTOM WL (TRAY / TRAY PROCEDURE) ×1 IMPLANT
PAD ARMBOARD 7.5X6 YLW CONV (MISCELLANEOUS) ×1 IMPLANT
PAD POSITIONING PINK XL (MISCELLANEOUS) ×1 IMPLANT
PORT ACCESS TROCAR AIRSEAL 12 (TROCAR) IMPLANT
SEAL CANN UNIV 5-8 DVNC XI (MISCELLANEOUS) ×4 IMPLANT
SEAL XI 5MM-8MM UNIVERSAL (MISCELLANEOUS) ×4
SET TRI-LUMEN FLTR TB AIRSEAL (TUBING) ×1 IMPLANT
SOL PREP POV-IOD 4OZ 10% (MISCELLANEOUS) ×2 IMPLANT
SPIKE FLUID TRANSFER (MISCELLANEOUS) ×1 IMPLANT
SPONGE T-LAP 18X18 ~~LOC~~+RFID (SPONGE) IMPLANT
SURGIFLO W/THROMBIN 8M KIT (HEMOSTASIS) IMPLANT
SUT MNCRL AB 4-0 PS2 18 (SUTURE) IMPLANT
SUT PDS AB 1 TP1 96 (SUTURE) IMPLANT
SUT VIC AB 0 CT1 27 (SUTURE)
SUT VIC AB 0 CT1 27XBRD ANTBC (SUTURE) IMPLANT
SUT VIC AB 2-0 CT1 27 (SUTURE)
SUT VIC AB 2-0 CT1 TAPERPNT 27 (SUTURE) IMPLANT
SUT VIC AB 4-0 PS2 18 (SUTURE) ×2 IMPLANT
SUT VLOC 180 0 9IN  GS21 (SUTURE)
SUT VLOC 180 0 9IN GS21 (SUTURE) IMPLANT
SYR 10ML LL (SYRINGE) IMPLANT
SYS BAG RETRIEVAL 10MM (BASKET)
SYS WOUND ALEXIS 18CM MED (MISCELLANEOUS)
SYSTEM BAG RETRIEVAL 10MM (BASKET) IMPLANT
SYSTEM WOUND ALEXIS 18CM MED (MISCELLANEOUS) IMPLANT
TOWEL OR NON WOVEN STRL DISP B (DISPOSABLE) IMPLANT
TRAP SPECIMEN MUCUS 40CC (MISCELLANEOUS) IMPLANT
TRAY FOLEY MTR SLVR 16FR STAT (SET/KITS/TRAYS/PACK) ×1 IMPLANT
TROCAR PORT AIRSEAL 5X120 (TROCAR) IMPLANT
UNDERPAD 30X36 HEAVY ABSORB (UNDERPADS AND DIAPERS) ×2 IMPLANT
WATER STERILE IRR 1000ML POUR (IV SOLUTION) ×1 IMPLANT
YANKAUER SUCT BULB TIP 10FT TU (MISCELLANEOUS) IMPLANT

## 2022-09-28 NOTE — Anesthesia Postprocedure Evaluation (Signed)
Anesthesia Post Note  Patient: Toni Schwartz  Procedure(s) Performed: XI ROBOTIC ASSISTED SALPINGO OOPHORECTOMY (Bilateral)     Patient location during evaluation: PACU Anesthesia Type: General Level of consciousness: awake and alert Pain management: pain level controlled Vital Signs Assessment: post-procedure vital signs reviewed and stable Respiratory status: spontaneous breathing, nonlabored ventilation, respiratory function stable and patient connected to nasal cannula oxygen Cardiovascular status: blood pressure returned to baseline and stable Postop Assessment: no apparent nausea or vomiting Anesthetic complications: no  No notable events documented.  Last Vitals:  Vitals:   09/28/22 0959 09/28/22 1000  BP: 116/64 116/64  Pulse: 61 63  Resp: 14 15  Temp:    SpO2: 90% 92%    Last Pain:  Vitals:   09/28/22 1000  TempSrc:   PainSc: 0-No pain                 Barnet Glasgow

## 2022-09-28 NOTE — Interval H&P Note (Signed)
History and Physical Interval Note:  09/28/2022 7:15 AM  Toni Schwartz  has presented today for surgery, with the diagnosis of BRCA2 MUTATION.  The various methods of treatment have been discussed with the patient and family. After consideration of risks, benefits and other options for treatment, the patient has consented to  Procedure(s): XI ROBOTIC ASSISTED SALPINGO OOPHORECTOMY (Bilateral) as a surgical intervention.  The patient's history has been reviewed, patient examined, no change in status, stable for surgery.  I have reviewed the patient's chart and labs.  Questions were answered to the patient's satisfaction.     Keigan Girten

## 2022-09-28 NOTE — Transfer of Care (Signed)
Immediate Anesthesia Transfer of Care Note  Patient: Toni Schwartz  Procedure(s) Performed: XI ROBOTIC ASSISTED SALPINGO OOPHORECTOMY (Bilateral)  Patient Location: PACU  Anesthesia Type:General  Level of Consciousness: awake, alert , and oriented  Airway & Oxygen Therapy: Patient Spontanous Breathing and Patient connected to nasal cannula oxygen  Post-op Assessment: Report given to RN and Post -op Vital signs reviewed and stable  Post vital signs: Reviewed and stable  Last Vitals:  Vitals Value Taken Time  BP 144/79 09/28/22 0913  Temp 36.5 C 09/28/22 0913  Pulse 58 09/28/22 0913  Resp 16 09/28/22 0913  SpO2 100 % 09/28/22 0913    Last Pain:  Vitals:   09/28/22 0630  TempSrc:   PainSc: 1       Patients Stated Pain Goal: 1 (64/38/37 7939)  Complications: No notable events documented.

## 2022-09-28 NOTE — Discharge Instructions (Signed)
AFTER SURGERY INSTRUCTIONS   Return to work: 4-6 weeks if applicable   Activity: 1. Be up and out of the bed during the day.  Take a nap if needed.  You may walk up steps but be careful and use the hand rail.  Stair climbing will tire you more than you think, you may need to stop part way and rest.    2. No lifting or straining for 6 weeks over 10 pounds. No pushing, pulling, straining for 6 weeks.   3. No driving for around 1 week(s).  Do not drive if you are taking narcotic pain medicine and make sure that your reaction time has returned.    4. You can shower as soon as the next day after surgery. Shower daily.  Use your regular soap and water (not directly on the incision) and pat your incision(s) dry afterwards; don't rub.  No tub baths or submerging your body in water until cleared by your surgeon. If you have the soap that was given to you by pre-surgical testing that was used before surgery, you do not need to use it afterwards because this can irritate your incisions.    5. No sexual activity and nothing in the vagina for 4 weeks, 10 weeks if you have a hysterectomy (removal of the uterus and cervix).   6. You may experience a small amount of clear drainage from your incisions, which is normal.  If the drainage persists, increases, or changes color please call the office.   7. Do not use creams, lotions, or ointments such as neosporin on your incisions after surgery until advised by your surgeon because they can cause removal of the dermabond glue on your incisions.     8. You may experience vaginal spotting after surgery or around the 6-8 week mark from surgery when the stitches at the top of the vagina begin to dissolve (if you have a hysterectomy).  The spotting is normal but if you experience heavy bleeding, call our office.   9. You can continue on your home pain medication regimen. Monitor your Tylenol intake to a max of 4,000 mg in a 24 hour period.    Diet: 1. Low sodium Heart  Healthy Diet is recommended but you are cleared to resume your normal (before surgery) diet after your procedure.   2. It is safe to use a laxative, such as Miralax or Colace, if you have difficulty moving your bowels. You have been prescribed Sennakot-S to take at bedtime every evening after surgery to keep bowel movements regular and to prevent constipation.     Wound Care: 1. Keep clean and dry.  Shower daily.   Reasons to call the Doctor: Fever - Oral temperature greater than 100.4 degrees Fahrenheit Foul-smelling vaginal discharge Difficulty urinating Nausea and vomiting Increased pain at the site of the incision that is unrelieved with pain medicine. Difficulty breathing with or without chest pain New calf pain especially if only on one side Sudden, continuing increased vaginal bleeding with or without clots.   Contacts: For questions or concerns you should contact:   Dr. Bernadene Bell at [place], NP at [phone]   After Hours: call [phone] and have the GYN Oncologist paged/contacted (after 5 pm or on the weekends).   Messages sent via mychart are for non-urgent matters and are not responded to after hours so for urgent needs, please call the after hours number.

## 2022-09-28 NOTE — Op Note (Signed)
GYNECOLOGIC ONCOLOGY OPERATIVE NOTE  Date of Service: 09/28/2022  Preoperative Diagnosis: BRCA2 MUTATION  Postoperative Diagnosis: Same  Procedures: Procedure(s): [place] OOPHORECTOMY  Surgeon: Bernadene Bell, MD  Assistants: Lahoma Crocker, MD  Anesthesia: General  Estimated Blood Loss: 71m    Fluids: 900 ml, crystalloid  Urine Output: 200 ml, clear yellow  Findings: On entry to abdomen, normal upper abdominal survey with smooth diaphragm, liver, stomach and normal appearing omentum and bowel. Thin filmy adhesions of omentum to inferior midline abdominal wall. Adhesions of bladder to anterior lower uterine segment. Thin filmy adhesions of the cecum to the right pelvic sidewall and of the sigmoid to the left pelvic sidewall. Approximately 2cm defect to the right of midline in the anterior inferior abdominal wall, cephalad to the bladder. Approximately 1.5cm simple appearing cyst on bilateral ovaries. Small paratubal cyst on right. Small phlebolith floating in posterior cul-de-sac, removed.   Specimens:  ID Type Source Tests Collected by Time Destination  1 : BILATERAL TUBES AND OVARIES Tissue PATH Gyn tumor resection SURGICAL PATHOLOGY NBernadene Bell MD 09/28/2022 [phone]  A : PELVIC WASHINGS Body Fluid PATH Cytology Pelvic Washing CYTOLOGY - NON PAP NBernadene Bell MD [id number]    Complications:  None  Indications for Procedure: Toni Fleissneris a 67y.o. woman with a BRCA2 mutation.  Prior to the procedure, all risks, benefits, and alternatives were discussed and informed surgical consent was signed.  Procedure: Patient was taken to the operating room where general anesthesia was achieved.  She was positioned in dorsal lithotomy and prepped and draped.  A foley catheter was inserted into the bladder.  The cervix was noted to be stenotic, so no manipulator was placed.  A 12 mm incision was made in the left upper quadrant near Palmer's point.   The abdomen was entered with a 5 mm OptiView trocar under direct visualization.  The abdomen was insufflated, the patient placed in steep Trendelenburg, and additional trocars were placed as follows: an 880mrobotic trocar superior to the umbilicus, one 8 mm robotic trocar in the right abdomen, and one 8 mm robotic trocar in the left abdomen.  The left upper quadrant trocar was removed and replaced with a 12 mm airseal trocar.  All trocars were placed under direct visualization.  The bowels were moved into the upper abdomen.  The DaVinci robotic surgical system was brought to the patient's bedside and docked.  Pelvic washings were obtained. Adhesions of the cecum to the right pelvic sidewall were lysed with scissors. The right retroperitoneum entered.  The right ureter was identified.  The right infundibulopelvic ligament was isolated, cauterized, and transected.  The broad was incised to the uterine cornu.  The utero-ovarian ligament and the proximal fallopian tube were isolated, cauterized, and transected.  The same procedure was performed on the contralateral side. Adhesions of the sigmoid colon to the left pelvic sidewall were lysed with scissors.  Both specimens along with a phlebolith found in the posterior cul-de-sac were placed into an Endo-Catch bag and removed through the 1224mrocar.  The pelvis was irrigated and all operative sites were found to be hemostatic.  All instruments were removed and the robot was taken from the patient's bedside.  The fascia at the 12 mm incision was closed with 0 Vicryl with the assistance of a PMI device. The abdomen was desufflated and all ports were removed. The skin at all incisions was closed with 4-0 Vicryl to reapproximate the subcutaneous tissue and 4-0 monocryl  in a subcuticular fashion followed by surgical glue.  Patient tolerated the procedure well. Sponge, lap, and instrument counts were correct.  No perioperative antibiotic prophylaxis was indicated for this  procedure.  She was extubated and taken to the PACU in stable condition.  Bernadene Bell, MD Gynecologic Oncology

## 2022-09-28 NOTE — Anesthesia Procedure Notes (Signed)
Procedure Name: Intubation Date/Time: 09/28/2022 7:40 AM  Performed by: Chey Cho D, CRNAPre-anesthesia Checklist: Patient identified, Emergency Drugs available, Suction available and Patient being monitored Patient Re-evaluated:Patient Re-evaluated prior to induction Oxygen Delivery Method: Circle system utilized Preoxygenation: Pre-oxygenation with 100% oxygen Induction Type: IV induction Ventilation: Mask ventilation without difficulty Laryngoscope Size: Mac and 3 Grade View: Grade I Tube type: Oral Tube size: 7.0 mm Number of attempts: 1 Airway Equipment and Method: Stylet and Oral airway Placement Confirmation: ETT inserted through vocal cords under direct vision, positive ETCO2 and breath sounds checked- equal and bilateral Secured at: 21 cm Tube secured with: Tape Dental Injury: Teeth and Oropharynx as per pre-operative assessment

## 2022-09-29 ENCOUNTER — Telehealth: Payer: Self-pay

## 2022-09-29 ENCOUNTER — Encounter (HOSPITAL_COMMUNITY): Payer: Self-pay | Admitting: Psychiatry

## 2022-09-29 LAB — CYTOLOGY - NON PAP

## 2022-09-29 LAB — SURGICAL PATHOLOGY

## 2022-09-29 NOTE — Telephone Encounter (Signed)
Spoke with Ms. Honse this morning. She states she is eating, drinking and urinating well. She has not had a BM yet but is passing gas. She is taking senokot as prescribed. Told her to increase it to 2 tabs bid with 8 oz of water. If no good BM by tomorrow add a capful of Miralax bid. Pt verbalized understanding. Encouraged her to drink plenty of water. She denies fever or chills. Incisions are dry and intact. She rates her pain 2/10. Her pain is controlled with tramadol.    Instructed to call office with any fever, chills, purulent drainage, uncontrolled pain or any other questions or concerns. Patient verbalizes understanding.   Pt aware of post op appointments as well as the office number [phone] and after hours number [phone] to call if she has any questions or concerns

## 2022-10-01 ENCOUNTER — Telehealth: Payer: Self-pay | Admitting: *Deleted

## 2022-10-01 NOTE — Telephone Encounter (Signed)
Call from patient to clarify post op instructions.  Reports different instructions on pre and post op instructions. Requesting clarification on lifting and driving instructions. Advised no pulling, pushing or lifting >10 pounds for 6 weeks. Advised needs to be able to get into and out of car, without pain that would cause hesitation and not need narcotic pain medication, usually at least 1 week.  States doing very well. Has only required one pain pill. Otherwise just mild soreness. Advised can readdress post op instructions when she comes from post-op.

## 2022-10-01 NOTE — Telephone Encounter (Signed)
Call to patient. Advised previous call reviewed by Joylene John NP. Advised to follow the instructions on page 6/12 of after visit summary or what was discussed in pre-op visit.  Patient voiced understanding.

## 2022-10-11 ENCOUNTER — Inpatient Hospital Stay: Payer: Medicare PPO

## 2022-10-11 ENCOUNTER — Other Ambulatory Visit: Payer: Self-pay

## 2022-10-11 ENCOUNTER — Inpatient Hospital Stay: Payer: Medicare PPO | Attending: Psychiatry | Admitting: Psychiatry

## 2022-10-11 VITALS — BP 118/68 | HR 89 | Temp 98.6°F | Resp 14 | Ht 62.0 in | Wt 167.0 lb

## 2022-10-11 DIAGNOSIS — Z1502 Genetic susceptibility to malignant neoplasm of ovary: Secondary | ICD-10-CM | POA: Diagnosis not present

## 2022-10-11 DIAGNOSIS — R3915 Urgency of urination: Secondary | ICD-10-CM

## 2022-10-11 DIAGNOSIS — Z1509 Genetic susceptibility to other malignant neoplasm: Secondary | ICD-10-CM | POA: Insufficient documentation

## 2022-10-11 DIAGNOSIS — N3001 Acute cystitis with hematuria: Secondary | ICD-10-CM | POA: Insufficient documentation

## 2022-10-11 DIAGNOSIS — Z7189 Other specified counseling: Secondary | ICD-10-CM

## 2022-10-11 DIAGNOSIS — Z148 Genetic carrier of other disease: Secondary | ICD-10-CM | POA: Diagnosis not present

## 2022-10-11 DIAGNOSIS — Z1501 Genetic susceptibility to malignant neoplasm of breast: Secondary | ICD-10-CM | POA: Diagnosis not present

## 2022-10-11 DIAGNOSIS — Z90722 Acquired absence of ovaries, bilateral: Secondary | ICD-10-CM | POA: Diagnosis not present

## 2022-10-11 LAB — URINALYSIS, COMPLETE (UACMP) WITH MICROSCOPIC
Bilirubin Urine: NEGATIVE
Glucose, UA: NEGATIVE mg/dL
Ketones, ur: NEGATIVE mg/dL
Nitrite: NEGATIVE
Protein, ur: NEGATIVE mg/dL
Specific Gravity, Urine: 1.01 (ref 1.005–1.030)
pH: 5 (ref 5.0–8.0)

## 2022-10-11 MED ORDER — NITROFURANTOIN MONOHYD MACRO 100 MG PO CAPS: 100.0000 mg | ORAL_CAPSULE | Freq: Two times a day (BID) | ORAL | 0 refills | Status: AC

## 2022-10-11 NOTE — Patient Instructions (Signed)
It was a pleasure to see you in clinic today. - We will let you know based on your urine if you need any antibiotics - Okay to walk. - No lifting >10lbs until 6 weeks after surgery. - Okay to return to routine care.  Thank you very much for allowing me to provide care for you today.  I appreciate your confidence in choosing our Gynecologic Oncology team at [facility].  If you have any questions about your visit today please call our office or send [place] a MyChart message and we will get back to you as soon as possible.

## 2022-10-11 NOTE — Progress Notes (Unsigned)
Gynecologic Oncology Return Clinic Visit  Date of Service: 10/11/2022 Referring Provider: Alonna Buckler, MD [place],  [place] [id number]***  Assessment & Plan: Toni Schwartz is a 67 y.o. woman with Stage *** who is *** weeks s/p *** on 09/28/22. Benign cysts  Postop: - Pt recovering well from surgery and healing appropriately postoperatively - Intraoperative findings and pathology results reviewed. - Ongoing postoperative expectations and precautions reviewed. Continue with no lifting >10lbs through 6 weeks postoperatively - Pt works ***. 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  Bernadene Bell, MD Gynecologic Oncology   Medical Decision Making I personally spent  TOTAL *** minutes face-to-face and non-face-to-face in the care of this patient, which includes all pre, intra, and post visit time on the date of service.  *** minutes spent reviewing records prior to the visit *** Minutes in patient contact      *** minutes in other billable services *** minutes charting , conferring with consultants etc.   ----------------------- Reason for Visit: Postop***  Treatment History: Oncology History  Breast cancer [facility])   Initial Diagnosis   Breast cancer ([place])   07/23/2019 Genetic Testing   BRCA2 [id number] pathogenic variant identified on the common hereditary cancer panel.  The Common Hereditary Gene Panel offered by [facility] includes sequencing and/or deletion duplication testing of the following 48 genes: APC, ATM, AXIN2, BARD1, BMPR1A, BRCA1, BRCA2, BRIP1, CDH1, CDK4, CDKN2A (p14ARF), CDKN2A (p16INK4a), CHEK2, CTNNA1, DICER1, EPCAM (Deletion/duplication testing only), GREM1 (promoter region deletion/duplication testing only), KIT, MEN1, MLH1, MSH2, MSH3, MSH6, MUTYH, NBN, NF1, NHTL1, PALB2, PDGFRA, PMS2, POLD1, POLE, PTEN, RAD50, RAD51C, RAD51D, RNF43, SDHB, SDHC, SDHD, SMAD4,  SMARCA4. STK11, TP53, TSC1, TSC2, and VHL.  The following genes were evaluated for sequence changes only: SDHA and HOXB13 c.251G>A variant only. The report date is 07/23/2019.     Interval History: Pt reports that she is recovering well from surgery. She is using *** for pain. She is eating and drinking well. She is voiding without issue and having regular bowel movements.   ***pressure since Saturday, no burning, urgency but stable from baseline  Not takin0g needin0g anythin0g for pain No bleeding  Past Medical/Surgical History: Past Medical History:  Diagnosis Date   Breast cancer ([place])    Stage I-right   Family history of bladder cancer    Family history of colon cancer    Family history of ovarian cancer    GERD (gastroesophageal reflux disease)    Hypothyroidism     Past Surgical History:  Procedure Laterality Date   BILATERAL TOTAL MASTECTOMY WITH AXILLARY LYMPH NODE DISSECTION  2008   BREAST LUMPECTOMY     times 2   CESAREAN SECTION     DIAGNOSTIC LAPAROSCOPY  1987   infertility workup   ROBOTIC ASSISTED SALPINGO OOPHERECTOMY Bilateral 09/28/2022   Procedure: XI ROBOTIC ASSISTED SALPINGO OOPHORECTOMY;  Surgeon: Bernadene Bell, MD;  Location: [facility];  Service: Gynecology;  Laterality: Bilateral;    Family History  Problem Relation Age of Onset   Bladder Cancer Father 29       had recurrance in later 56s   Heart Problems Paternal Uncle    Heart failure Maternal Grandmother    COPD Maternal Grandfather    Heart disease Paternal Grandmother    Lung disease Paternal Grandfather        cilicosis   Stomach cancer Other  MGM's mother   Colon cancer Cousin        dx in her 62s   Ovarian cancer Cousin        dx in her 57s    Social History   Socioeconomic History   Marital status: Married    Spouse name: Not on file   Number of children: Not on file   Years of education: Not on file   Highest education level: Not on file  Occupational History   Not  on file  Tobacco Use   Smoking status: Never   Smokeless tobacco: Never  Vaping Use   Vaping Use: Never used  Substance and Sexual Activity   Alcohol use: Never   Drug use: Never   Sexual activity: Not Currently  Other Topics Concern   Not on file  Social History Narrative   Not on file   Social Determinants of Health   Financial Resource Strain: Not on file  Food Insecurity: Not on file  Transportation Needs: Not on file  Physical Activity: Not on file  Stress: Not on file  Social Connections: Not on file    Current Medications:  Current Outpatient Medications:    Biotin 5 MG CAPS, Take 20 mg by mouth daily., Disp: , Rfl:    Calcium Citrate-Vitamin D (CITRACAL + D PO), Take 2 tablets by mouth daily., Disp: , Rfl:    Cholecalciferol (VITAMIN D3) 1000 UNITS CAPS, Take 1,000 Units by mouth daily., Disp: , Rfl:    Glucosamine-Chondroitin (GLUCOSAMINE CHONDR COMPLEX PO), Take 2 tablets by mouth daily., Disp: , Rfl:    levothyroxine (SYNTHROID, LEVOTHROID) 88 MCG tablet, Take 88 mcg by mouth daily before breakfast., Disp: , Rfl:    Multiple Vitamins-Minerals (CENTRUM SILVER PO), Take 1 tablet by mouth daily., Disp: , Rfl:    pantoprazole (PROTONIX) 40 MG tablet, Take 40 mg by mouth., Disp: , Rfl:    senna-docusate (SENOKOT-S) 8.6-50 MG tablet, Take 2 tablets by mouth at bedtime. For AFTER surgery, do not take if having diarrhea, Disp: 30 tablet, Rfl: 0   traMADol (ULTRAM) 50 MG tablet, Take 1 tablet (50 mg total) by mouth every 6 (six) hours as needed for severe pain. For AFTER surgery only, do not take and drive, Disp: 10 tablet, Rfl: 0  Review of Symptoms: Complete 10-system review is negative except ***as above in Interval History.  Physical Exam: There were no vitals taken for this visit. General: ***Alert, oriented, no acute distress. HEENT: ***Normocephalic, atraumatic. Neck symmetric without masses. Sclera anicteric.  Chest: ***Normal work of breathing. ***Clear to  auscultation bilaterally.   Cardiovascular: ***Regular rate and rhythm, no murmurs. Abdomen: ***Soft, nontender.  Normoactive bowel sounds.  No masses or hepatosplenomegaly appreciated.  ***Well-healing incision***s. Extremities: ***Grossly normal range of motion.  Warm, well perfused.  No edema bilaterally. Skin: ***No rashes or lesions noted. GU: Normal appearing external genitalia without erythema, excoriation, or lesions.  Speculum exam reveals ***.  Bimanual exam reveals ***. Exam chaperoned by ***  Laboratory & Radiologic Studies: ***

## 2022-10-13 ENCOUNTER — Encounter: Payer: Self-pay | Admitting: Psychiatry

## 2022-11-03 ENCOUNTER — Telehealth: Payer: Self-pay

## 2022-11-03 NOTE — Telephone Encounter (Signed)
Pt called office stating she had surgery in January. She has continued to be "guarded" in the things she does (lifting, pushing, pulling ec..) She states this week she has started having right side muscle pain. It is only when she coughs (she has had a chronic cough for years, so cough is not new). 3/10 on pain scale. Only hurts when she coughs. No fever/chills, no N/V. No diarrhea/constipation. No UTI S&S. No swelling or bruising. No vaginal bleeding/discharge. She states she did not fall or bump into anything. Her main reason for calling is to see what she can do for pain.   Advised heating pad on low/medium heat and Tylenol. Also advised to keep a watch for any of the above S&S. She voiced an understanding and will call back if anything changes.

## 2023-04-11 DIAGNOSIS — I8390 Asymptomatic varicose veins of unspecified lower extremity: Secondary | ICD-10-CM | POA: Diagnosis not present

## 2023-04-11 DIAGNOSIS — L821 Other seborrheic keratosis: Secondary | ICD-10-CM | POA: Diagnosis not present

## 2023-04-11 DIAGNOSIS — L738 Other specified follicular disorders: Secondary | ICD-10-CM | POA: Diagnosis not present

## 2023-04-11 DIAGNOSIS — L814 Other melanin hyperpigmentation: Secondary | ICD-10-CM | POA: Diagnosis not present

## 2023-04-29 ENCOUNTER — Inpatient Hospital Stay: Payer: Medicare PPO | Attending: Oncology | Admitting: Oncology

## 2023-04-29 VITALS — BP 129/73 | HR 90 | Temp 98.1°F | Resp 18 | Ht 62.0 in | Wt 167.7 lb

## 2023-04-29 DIAGNOSIS — Z9013 Acquired absence of bilateral breasts and nipples: Secondary | ICD-10-CM | POA: Diagnosis not present

## 2023-04-29 DIAGNOSIS — C50919 Malignant neoplasm of unspecified site of unspecified female breast: Secondary | ICD-10-CM | POA: Diagnosis not present

## 2023-04-29 DIAGNOSIS — Z90721 Acquired absence of ovaries, unilateral: Secondary | ICD-10-CM | POA: Diagnosis not present

## 2023-04-29 DIAGNOSIS — Z853 Personal history of malignant neoplasm of breast: Secondary | ICD-10-CM | POA: Insufficient documentation

## 2023-04-29 NOTE — Progress Notes (Signed)
  [facility] OFFICE PROGRESS NOTE   Diagnosis: DCIS, breast cancer  INTERVAL HISTORY:   Toni Schwartz returns as scheduled.  Feels well.  Good appetite.  No new complaint.  No change over the chest wall.  She underwent a salpingooophorectomy in January.  Pathology showed no malignancy.  Objective:  Vital signs in last 24 hours:  Blood pressure 129/73, pulse 90, temperature 98.1 F (36.7 C), temperature source Oral, resp. rate 18, height 5\' 2"  (1.575 m), weight 167 lb 11.2 oz (76.1 kg), SpO2 99%.     Lymphatics: No cervical, supraclavicular, axillary, or inguinal nodes Resp: End inspiratory coarse rhonchi at the posterior base bilaterally, no respiratory distress Cardio: Regular rate and rhythm GI: No hepatosplenomegaly, no mass, nontender Vascular: No leg edema Breast: Status post bilateral mastectomy with TRAM reconstructions.  No evidence for chest wall tumor recurrence.  Lab Results:  Lab Results  Component Value Date   WBC 8.1 09/20/2022   HGB 13.2 09/20/2022   HCT 42.5 09/20/2022   MCV 98.4 09/20/2022   PLT 247 09/20/2022   NEUTROABS 3.0 09/08/2006    CMP  Lab Results  Component Value Date   NA 140 09/20/2022   K 4.6 09/20/2022   CL 106 09/20/2022   CO2 26 09/20/2022   GLUCOSE 96 09/20/2022   BUN 15 09/20/2022   CREATININE 1.03 (H) 09/20/2022   CALCIUM 9.0 09/20/2022   PROT 7.1 09/20/2022   ALBUMIN 4.0 09/20/2022   AST 25 09/20/2022   ALT 26 09/20/2022   ALKPHOS 70 09/20/2022   BILITOT 0.5 09/20/2022   GFRNONAA 59 (L) 09/20/2022     Medications: I have reviewed the patient's current medications.   Assessment/Plan: Stage I right-sided breast cancer status post 4 cycles of adjuvant AC chemotherapy. She began Arimidex in February 2008, completed at the end of January 2013. 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 Bilateral mastectomies  and TRAM reconstruction in April 2008. BRCA2 heterozygous Bilateral salpingo-oophorectomy 09/28/2022      Disposition: Toni Schwartz is in clinical remission from breast cancer.  She would like to continue follow-up at the cancer center.  She will return for an office visit in 1 year.  Thornton Papas, MD  04/29/2023  10:03 AM

## 2023-05-12 DIAGNOSIS — Z23 Encounter for immunization: Secondary | ICD-10-CM | POA: Diagnosis not present

## 2023-05-12 DIAGNOSIS — E039 Hypothyroidism, unspecified: Secondary | ICD-10-CM | POA: Diagnosis not present

## 2023-05-12 DIAGNOSIS — Z7185 Encounter for immunization safety counseling: Secondary | ICD-10-CM | POA: Diagnosis not present

## 2023-05-12 DIAGNOSIS — R350 Frequency of micturition: Secondary | ICD-10-CM | POA: Diagnosis not present

## 2023-05-12 DIAGNOSIS — Z0000 Encounter for general adult medical examination without abnormal findings: Secondary | ICD-10-CM | POA: Diagnosis not present

## 2023-05-12 DIAGNOSIS — E7800 Pure hypercholesterolemia, unspecified: Secondary | ICD-10-CM | POA: Diagnosis not present

## 2023-05-12 DIAGNOSIS — C50811 Malignant neoplasm of overlapping sites of right female breast: Secondary | ICD-10-CM | POA: Diagnosis not present

## 2023-05-12 DIAGNOSIS — Z79899 Other long term (current) drug therapy: Secondary | ICD-10-CM | POA: Diagnosis not present

## 2023-05-12 DIAGNOSIS — E559 Vitamin D deficiency, unspecified: Secondary | ICD-10-CM | POA: Diagnosis not present

## 2023-05-17 DIAGNOSIS — M8589 Other specified disorders of bone density and structure, multiple sites: Secondary | ICD-10-CM | POA: Diagnosis not present

## 2023-05-17 DIAGNOSIS — Z780 Asymptomatic menopausal state: Secondary | ICD-10-CM | POA: Diagnosis not present

## 2023-05-17 DIAGNOSIS — Z13820 Encounter for screening for osteoporosis: Secondary | ICD-10-CM | POA: Diagnosis not present

## 2023-05-17 DIAGNOSIS — M85852 Other specified disorders of bone density and structure, left thigh: Secondary | ICD-10-CM | POA: Diagnosis not present

## 2023-06-01 DIAGNOSIS — E039 Hypothyroidism, unspecified: Secondary | ICD-10-CM | POA: Diagnosis not present

## 2023-06-01 DIAGNOSIS — M8589 Other specified disorders of bone density and structure, multiple sites: Secondary | ICD-10-CM | POA: Diagnosis not present

## 2023-08-29 DIAGNOSIS — E039 Hypothyroidism, unspecified: Secondary | ICD-10-CM | POA: Diagnosis not present

## 2023-12-14 DIAGNOSIS — B029 Zoster without complications: Secondary | ICD-10-CM | POA: Diagnosis not present

## 2023-12-14 DIAGNOSIS — Z1330 Encounter for screening examination for mental health and behavioral disorders, unspecified: Secondary | ICD-10-CM | POA: Diagnosis not present

## 2023-12-14 DIAGNOSIS — C50812 Malignant neoplasm of overlapping sites of left female breast: Secondary | ICD-10-CM | POA: Diagnosis not present

## 2023-12-14 DIAGNOSIS — E039 Hypothyroidism, unspecified: Secondary | ICD-10-CM | POA: Diagnosis not present

## 2023-12-14 DIAGNOSIS — C50811 Malignant neoplasm of overlapping sites of right female breast: Secondary | ICD-10-CM | POA: Diagnosis not present

## 2024-05-02 ENCOUNTER — Inpatient Hospital Stay: Payer: Medicare PPO | Attending: Oncology | Admitting: Oncology

## 2024-05-02 VITALS — BP 119/82 | HR 87 | Temp 97.8°F | Resp 18 | Ht 62.0 in | Wt 161.8 lb

## 2024-05-02 DIAGNOSIS — Z9013 Acquired absence of bilateral breasts and nipples: Secondary | ICD-10-CM | POA: Diagnosis not present

## 2024-05-02 DIAGNOSIS — C50919 Malignant neoplasm of unspecified site of unspecified female breast: Secondary | ICD-10-CM | POA: Diagnosis not present

## 2024-05-02 DIAGNOSIS — Z9221 Personal history of antineoplastic chemotherapy: Secondary | ICD-10-CM | POA: Diagnosis not present

## 2024-05-02 DIAGNOSIS — Z853 Personal history of malignant neoplasm of breast: Secondary | ICD-10-CM | POA: Diagnosis not present

## 2024-05-02 DIAGNOSIS — Z923 Personal history of irradiation: Secondary | ICD-10-CM | POA: Diagnosis not present

## 2024-05-02 DIAGNOSIS — Z08 Encounter for follow-up examination after completed treatment for malignant neoplasm: Secondary | ICD-10-CM | POA: Diagnosis present

## 2024-05-02 DIAGNOSIS — Z90722 Acquired absence of ovaries, bilateral: Secondary | ICD-10-CM | POA: Insufficient documentation

## 2024-05-02 NOTE — Progress Notes (Signed)
  [facility] OFFICE PROGRESS NOTE   Diagnosis: Breast cancer  INTERVAL HISTORY:   Ms. Toni Schwartz returns as scheduled.  She feels well.  Good appetite.  She reports intentional weight loss.  No change over either chest wall.  She has intermittent hot flashes.  Objective:  Vital signs in last 24 hours:  Blood pressure 119/82, pulse 87, temperature 97.8 F (36.6 C), temperature source Temporal, resp. rate 18, height 5' 2 (1.575 m), weight 161 lb 12.8 oz (73.4 kg), SpO2 100%.   Lymphatics: No cervical, supraclavicular, or axillary nodes Resp: End inspiratory rhonchi at the bilateral lower posterior chest, no respiratory distress Cardio: Regular rate and rhythm GI: No hepatosplenomegaly Vascular: No leg edema Breast: Bilateral mastectomy, no evidence for chest wall tumor recurrence, firm tissue at the 10 o'clock position outside of the left TRAM (she reports this has been present chronically)   Lab Results:  Lab Results  Component Value Date   WBC 8.1 09/20/2022   HGB 13.2 09/20/2022   HCT 42.5 09/20/2022   MCV 98.4 09/20/2022   PLT 247 09/20/2022   NEUTROABS 3.0 09/08/2006    CMP  Lab Results  Component Value Date   NA 140 09/20/2022   K 4.6 09/20/2022   CL 106 09/20/2022   CO2 26 09/20/2022   GLUCOSE 96 09/20/2022   BUN 15 09/20/2022   CREATININE 1.03 (H) 09/20/2022   CALCIUM 9.0 09/20/2022   PROT 7.1 09/20/2022   ALBUMIN 4.0 09/20/2022   AST 25 09/20/2022   ALT 26 09/20/2022   ALKPHOS 70 09/20/2022   BILITOT 0.5 09/20/2022   GFRNONAA 59 (L) 09/20/2022    No results found for: CEA1, CEA, CAN199, CA125  No results found for: INR, LABPROT  Imaging:  No results found.  Medications: I have reviewed the patient's current medications.   Assessment/Plan: Stage I right-sided breast cancer status post 4 cycles of adjuvant AC chemotherapy. She began Arimidex in February 2008, completed at the end of January 2013. Left breast ductal  carcinoma in situ/lobular carcinoma in situ diagnosed in May 2002, status post left breast radiation and 3 years of tamoxifen. Bilateral mastectomies and TRAM reconstruction in April 2008. BRCA2 heterozygous Bilateral salpingo-oophorectomy 09/28/2022      Disposition: Toni Schwartz remains in clinical remission from breast cancer.  She would like continue follow-up in the oncology clinic.  She will return for an office visit in 1 year.  She reports her daughter tested negative for the BRCA 2 mutation.  I encouraged her to ask her son to be tested.  Arley Hof, MD  05/02/2024  10:05 AM

## 2024-07-02 ENCOUNTER — Telehealth: Payer: Self-pay | Admitting: *Deleted

## 2024-07-02 NOTE — Telephone Encounter (Signed)
 Ms. Vensel left VM asking for the following dates: Date of BRCA2 diagnosis 07-23-2019 Date of surgery of OOPHORECTOMY 09-28-2022 Called back and left VM to return call since her VM did not state her name.

## 2024-07-03 NOTE — Telephone Encounter (Signed)
 Toni Schwartz made aware of requested dates.

## 2025-05-02 ENCOUNTER — Ambulatory Visit: Admitting: Oncology
